# Patient Record
Sex: Male | Born: 1970 | Race: White | Hispanic: No | Marital: Married | Smoking: Never smoker
Health system: Southern US, Community
[De-identification: ages and names within clinical notes are randomized; demographics above are authoritative.]

## PROBLEM LIST (undated history)

## (undated) DIAGNOSIS — C44621 Squamous cell carcinoma of skin of unspecified upper limb, including shoulder: Secondary | ICD-10-CM

## (undated) DIAGNOSIS — F419 Anxiety disorder, unspecified: Secondary | ICD-10-CM

## (undated) DIAGNOSIS — Z8669 Personal history of other diseases of the nervous system and sense organs: Secondary | ICD-10-CM

## (undated) DIAGNOSIS — Z8589 Personal history of malignant neoplasm of other organs and systems: Secondary | ICD-10-CM

## (undated) DIAGNOSIS — Z8709 Personal history of other diseases of the respiratory system: Secondary | ICD-10-CM

## (undated) DIAGNOSIS — G4730 Sleep apnea, unspecified: Secondary | ICD-10-CM

## (undated) DIAGNOSIS — Z8701 Personal history of pneumonia (recurrent): Secondary | ICD-10-CM

## (undated) HISTORY — DX: Personal history of pneumonia (recurrent): Z87.01

## (undated) HISTORY — DX: Personal history of other diseases of the respiratory system: Z87.09

## (undated) HISTORY — DX: Anxiety disorder, unspecified: F41.9

## (undated) HISTORY — DX: Personal history of other diseases of the nervous system and sense organs: Z86.69

## (undated) HISTORY — DX: Squamous cell carcinoma of skin of unspecified upper limb, including shoulder: C44.621

---
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
## 2002-12-14 DIAGNOSIS — Z8669 Personal history of other diseases of the nervous system and sense organs: Secondary | ICD-10-CM

## 2002-12-14 HISTORY — DX: Personal history of other diseases of the nervous system and sense organs: Z86.69

## 2005-07-16 ENCOUNTER — Ambulatory Visit: Payer: Self-pay | Admitting: Family Medicine

## 2006-12-17 ENCOUNTER — Ambulatory Visit: Payer: Self-pay | Admitting: Family Medicine

## 2007-01-17 ENCOUNTER — Ambulatory Visit: Payer: Self-pay | Admitting: Family Medicine

## 2007-04-25 ENCOUNTER — Telehealth (INDEPENDENT_AMBULATORY_CARE_PROVIDER_SITE_OTHER): Payer: Self-pay | Admitting: Internal Medicine

## 2007-07-14 DIAGNOSIS — Z8709 Personal history of other diseases of the respiratory system: Secondary | ICD-10-CM | POA: Insufficient documentation

## 2007-07-14 HISTORY — DX: Personal history of other diseases of the respiratory system: Z87.09

## 2007-07-19 ENCOUNTER — Ambulatory Visit: Payer: Self-pay | Admitting: Family Medicine

## 2007-07-19 DIAGNOSIS — M25532 Pain in left wrist: Secondary | ICD-10-CM | POA: Insufficient documentation

## 2007-07-19 DIAGNOSIS — F40243 Fear of flying: Secondary | ICD-10-CM

## 2007-11-24 ENCOUNTER — Ambulatory Visit: Payer: Self-pay | Admitting: Family Medicine

## 2007-11-25 ENCOUNTER — Encounter (INDEPENDENT_AMBULATORY_CARE_PROVIDER_SITE_OTHER): Payer: Self-pay | Admitting: Internal Medicine

## 2008-01-20 ENCOUNTER — Ambulatory Visit: Payer: Self-pay | Admitting: Family Medicine

## 2008-01-20 DIAGNOSIS — R21 Rash and other nonspecific skin eruption: Secondary | ICD-10-CM

## 2008-01-31 ENCOUNTER — Telehealth (INDEPENDENT_AMBULATORY_CARE_PROVIDER_SITE_OTHER): Payer: Self-pay | Admitting: Internal Medicine

## 2008-02-21 ENCOUNTER — Ambulatory Visit: Payer: Self-pay | Admitting: Family Medicine

## 2008-02-21 ENCOUNTER — Encounter (INDEPENDENT_AMBULATORY_CARE_PROVIDER_SITE_OTHER): Payer: Self-pay | Admitting: *Deleted

## 2008-03-01 ENCOUNTER — Ambulatory Visit: Payer: Self-pay | Admitting: Family Medicine

## 2008-03-01 DIAGNOSIS — J069 Acute upper respiratory infection, unspecified: Secondary | ICD-10-CM | POA: Insufficient documentation

## 2008-03-09 ENCOUNTER — Telehealth (INDEPENDENT_AMBULATORY_CARE_PROVIDER_SITE_OTHER): Payer: Self-pay | Admitting: Internal Medicine

## 2008-03-22 ENCOUNTER — Ambulatory Visit: Payer: Self-pay | Admitting: Family Medicine

## 2008-04-20 ENCOUNTER — Telehealth (INDEPENDENT_AMBULATORY_CARE_PROVIDER_SITE_OTHER): Payer: Self-pay | Admitting: Internal Medicine

## 2008-04-20 ENCOUNTER — Ambulatory Visit: Payer: Self-pay | Admitting: Family Medicine

## 2008-04-20 DIAGNOSIS — J309 Allergic rhinitis, unspecified: Secondary | ICD-10-CM | POA: Insufficient documentation

## 2008-08-28 ENCOUNTER — Ambulatory Visit: Payer: Self-pay | Admitting: Obstetrics and Gynecology

## 2008-11-12 ENCOUNTER — Telehealth: Payer: Self-pay | Admitting: Internal Medicine

## 2009-02-04 ENCOUNTER — Telehealth: Payer: Self-pay | Admitting: Family Medicine

## 2009-02-26 ENCOUNTER — Ambulatory Visit: Payer: Self-pay | Admitting: Family Medicine

## 2009-02-26 DIAGNOSIS — K5289 Other specified noninfective gastroenteritis and colitis: Secondary | ICD-10-CM

## 2009-04-26 ENCOUNTER — Telehealth (INDEPENDENT_AMBULATORY_CARE_PROVIDER_SITE_OTHER): Payer: Self-pay | Admitting: Internal Medicine

## 2009-06-19 ENCOUNTER — Telehealth (INDEPENDENT_AMBULATORY_CARE_PROVIDER_SITE_OTHER): Payer: Self-pay | Admitting: Internal Medicine

## 2009-10-14 ENCOUNTER — Telehealth: Payer: Self-pay | Admitting: Family Medicine

## 2009-11-15 ENCOUNTER — Telehealth (INDEPENDENT_AMBULATORY_CARE_PROVIDER_SITE_OTHER): Payer: Self-pay | Admitting: Internal Medicine

## 2009-11-21 ENCOUNTER — Ambulatory Visit: Payer: Self-pay | Admitting: Family Medicine

## 2009-12-18 ENCOUNTER — Telehealth (INDEPENDENT_AMBULATORY_CARE_PROVIDER_SITE_OTHER): Payer: Self-pay | Admitting: Internal Medicine

## 2010-02-03 ENCOUNTER — Ambulatory Visit: Payer: Self-pay | Admitting: Family Medicine

## 2010-05-14 ENCOUNTER — Telehealth: Payer: Self-pay | Admitting: Family Medicine

## 2011-01-13 NOTE — Assessment & Plan Note (Signed)
Summary: CONGESTION,WHEEZING/CLE   Vital Signs:  Patient profile:   40 year old male Height:      65 inches Weight:      205.25 pounds BMI:     34.28 Temp:     99.6 degrees F oral Pulse rate:   88 / minute Pulse rhythm:   regular BP sitting:   122 / 80  (left arm) Cuff size:   large  Vitals Entered By: Delilah Shan CMA [facility]ull) (February 03, 2010 10:59 AM) CC: Congestion, wheezing   History of Present Illness: 40 yo with 1 week of URI symptoms. Started as runny nose, now moved to chest. Dry cough and wheezing started last night. Can't sleep.   Subjective fevers, night sweats. No shortness of breath or chest pain. Wife had similar symptoms last week and just got back from business trip where a lot of people were sick as well. No body aches. No n/v/d.  Current Medications (verified): 1)  4 Way Nasal Spray .... As Needed 2)  Alprazolam 0.25 Mg  Tabs (Alprazolam) .Marland Kitchen.. 1 30 To 60 Min Before Flight, Repeatin 4-6h As Needed Anxiety 3)  Azithromycin 250 Mg  Tabs (Azithromycin) .... 2 By  Mouth Today and Then 1 Daily For 4 Days 4)  Cheratussin Ac 100-10 Mg/49ml Syrp (Guaifenesin-Codeine) .... 5 Ml At Bedtime As Needed Cough.  Allergies (verified): No Known Drug Allergies  Review of Systems      See HPI General:  Complains of chills and fever. ENT:  Complains of nasal congestion; denies postnasal drainage, sinus pressure, and sore throat. CV:  Denies chest pain or discomfort. Resp:  Complains of cough and wheezing; denies shortness of breath and sputum productive. GI:  Denies abdominal pain, nausea, and vomiting.  Physical Exam  General:  alert, well-developed, well-nourished, and well-hydrated.  No increased WOB. Ears:  TMs retracted bilaterally. Nose:  mucosal erythema.   Mouth:  Oral mucosa and oropharynx without lesions or exudates.  Teeth in good repair. Lungs:  normal respiratory effort, no intercostal retractions, no accessory muscle use, and normal breath sounds.     Heart:  normal rate, regular rhythm, and no murmur.   Extremities:  no edema Psych:  normally interactive, not anxious appearing, and not depressed appearing.     Impression & Recommendations:  Problem # 1:  URI (ICD-465.9) Assessment New Given progression of symptoms, will treat for bronchitis/sinusitis with Zpack. Cheratussin as needed coughing spells. See pt instructions for details. His updated medication list for this problem includes:    Cheratussin Ac 100-10 Mg/52ml Syrp (Guaifenesin-codeine) .Marland KitchenMarland KitchenMarland KitchenMarland Kitchen 5 ml at bedtime as needed cough.  Complete Medication List: 1)  4 Way Nasal Spray  .... As needed 2)  Alprazolam 0.25 Mg Tabs (Alprazolam) .Marland Kitchen.. 1 30 to 60 min before flight, repeatin 4-6h as needed anxiety 3)  Azithromycin 250 Mg Tabs (Azithromycin) .... 2 by  mouth today and then 1 daily for 4 days 4)  Cheratussin Ac 100-10 Mg/44ml Syrp (Guaifenesin-codeine) .... 5 ml at bedtime as needed cough.  Patient Instructions: 1)  Take Zpack as directed.  Drink lots of fluids.  Treat sympotmatically with Mucinex, nasal saline irrigation, and Tylenol/Ibuprofen.  Cough suppressant at night. Call if not improving as expected in 5-7 days.  Prescriptions: CHERATUSSIN AC 100-10 MG/5ML SYRP (GUAIFENESIN-CODEINE) 5 ml at bedtime as needed cough.  #4 ounces x 0   Entered and Authorized by:   Ruthe Mannan MD   Signed by:   Ruthe Mannan MD on 02/03/2010   Method  used:   Print then Give to Patient   RxID:   [id number] AZITHROMYCIN 250 MG  TABS (AZITHROMYCIN) 2 by  mouth today and then 1 daily for 4 days  #6 x 0   Entered and Authorized by:   Ruthe Mannan MD   Signed by:   Ruthe Mannan MD on 02/03/2010   Method used:   Electronically to        [facility]* (retail)       [address]       [place], [place]  [address]       Ph: [phone]       Fax: [phone]   RxID:   [id number]   Current Allergies (reviewed today): No known allergies

## 2011-01-13 NOTE — Progress Notes (Signed)
Summary: Rx Zoloft  Phone Note Refill Request Call back at [phone] Message from:  [facility] on December 18, 2009 9:38 AM  Refills Requested: Medication #1:  ZOLOFT 50 MG  TABS Take one by mouth once a day   Last Refilled: 11/15/2009 Received faxed refill request, please advise   Method Requested: Telephone to Pharmacy Initial call taken by: Linde Gillis CMA [facility]),  December 18, 2009 9:39 AM  Follow-up for Phone Call        Rx completed  Billie-Lynn Tyler Deis FNP  December 18, 2009 12:26 PM   Medication phoned to [facility] pharmacy as instructed. Lewanda Rife LPN  December 18, 2009 2:53 PM     Prescriptions: ZOLOFT 50 MG  TABS (SERTRALINE HCL) Take one by mouth once a day  #30 x 6   Entered and Authorized by:   Gildardo Griffes FNP   Signed by:   Gildardo Griffes FNP on 12/18/2009   Method used:   Telephoned to ...         RxID:   [id number]

## 2011-01-13 NOTE — Progress Notes (Signed)
Summary: refill request for alprazolam  Phone Note Refill Request Message from:  Fax from Pharmacy  Refills Requested: Medication #1:  ALPRAZOLAM 0.25 MG  TABS 1 30 to 60 min before flight Faxed request from [place], [phone].  Initial call taken by: Lowella Petties CMA,  May 14, 2010 12:34 PM  Follow-up for Phone Call        Called to [facility]. Follow-up by: Lowella Petties CMA,  May 14, 2010 4:14 PM    Prescriptions: ALPRAZOLAM 0.25 MG  TABS (ALPRAZOLAM) 1 30 to 60 min before flight, repeatin 4-6h as needed anxiety  #10 x 0   Entered and Authorized by:   Shaune Leeks MD   Signed by:   Shaune Leeks MD on 05/14/2010   Method used:   Telephoned to ...       [facility]* (retail)       [address]       [place], [place]  [address]       Ph: [phone]       Fax: [phone]   RxID:   [id number]

## 2011-06-10 ENCOUNTER — Other Ambulatory Visit: Payer: Self-pay | Admitting: *Deleted

## 2011-06-13 ENCOUNTER — Other Ambulatory Visit: Payer: Self-pay | Admitting: Family Medicine

## 2011-06-13 DIAGNOSIS — Z0000 Encounter for general adult medical examination without abnormal findings: Secondary | ICD-10-CM

## 2011-06-16 ENCOUNTER — Encounter: Payer: Self-pay | Admitting: Family Medicine

## 2011-06-16 ENCOUNTER — Other Ambulatory Visit (INDEPENDENT_AMBULATORY_CARE_PROVIDER_SITE_OTHER): Payer: 59 | Admitting: Family Medicine

## 2011-06-16 DIAGNOSIS — Z0000 Encounter for general adult medical examination without abnormal findings: Secondary | ICD-10-CM

## 2011-06-16 LAB — LIPID PANEL
HDL: 42.8 mg/dL (ref >39.00)
Total CHOL/HDL Ratio: 5
Triglycerides: 114 mg/dL (ref 0.0–149.0)

## 2011-06-16 LAB — COMPREHENSIVE METABOLIC PANEL
ALT: 26 U/L (ref 0–53)
Albumin: 4.3 g/dL (ref 3.5–5.2)
CO2: 27 mEq/L (ref 19–32)
Calcium: 9.1 mg/dL (ref 8.4–10.5)
Chloride: 109 mEq/L (ref 96–112)
GFR: 86.09 mL/min (ref >60.00)
Glucose, Bld: 94 mg/dL (ref 70–99)
Sodium: 142 mEq/L (ref 135–145)
Total Bilirubin: 0.6 mg/dL (ref 0.3–1.2)
Total Protein: 7.1 g/dL (ref 6.0–8.3)

## 2011-06-19 ENCOUNTER — Ambulatory Visit (INDEPENDENT_AMBULATORY_CARE_PROVIDER_SITE_OTHER): Payer: 59 | Admitting: Family Medicine

## 2011-06-19 ENCOUNTER — Encounter: Payer: Self-pay | Admitting: Family Medicine

## 2011-06-19 VITALS — BP 128/84 | HR 76 | Temp 98.7°F | Ht 66.0 in | Wt 201.1 lb

## 2011-06-19 DIAGNOSIS — Z1211 Encounter for screening for malignant neoplasm of colon: Secondary | ICD-10-CM | POA: Insufficient documentation

## 2011-06-19 DIAGNOSIS — F988 Other specified behavioral and emotional disorders with onset usually occurring in childhood and adolescence: Secondary | ICD-10-CM

## 2011-06-19 DIAGNOSIS — Z0000 Encounter for general adult medical examination without abnormal findings: Secondary | ICD-10-CM | POA: Insufficient documentation

## 2011-06-19 NOTE — Assessment & Plan Note (Signed)
Some description consistent with ADD, however no childhood sxs. Will refer for testing, pt states affecting work.

## 2011-06-19 NOTE — Patient Instructions (Addendum)
Things are looking good today. You're up to date on tetanus. Blood work copy provided. Keep up good work. Work on more water, more fruits and vegetables. Return in 1-2 years for another physical. Pass by [facility]'s or Cynthia's office for referral.

## 2011-06-19 NOTE — Assessment & Plan Note (Signed)
Discussed healthy living, healthy eating. utd tetanus 2006. Reviewed blood work with patient.

## 2011-06-19 NOTE — Progress Notes (Signed)
Subjective:    Patient ID: Nicholas Saunders, male    DOB: 12-Jul-1971, 40 y.o.   MRN: [id number]  HPI CC: CPE  Presents to transfer care.  Wonders about ADHD.  Easily loses train of though at work, distractibility at work.  Thinks affecting job.  Also notes occurs at church and at home with wife.  Never had any issues as child.  Thinks progressing.  H/o anxiety but now ok.  Not hyperactive.  Doesn't feel depressed, has hobbies, no anhedonia.  Would like referral for testing.  + anxiety with flying, controlled well with xanax.  Will call when next flight for prescription.  Preventative: Last tetanus shot 2006. Last CPE last year.  Medications and allergies reviewed and updated in chart.   Patient Active Problem List  Diagnoses  . PANIC DISORDER  . ADD  . ALLERGIC RHINITIS  . WRIST PAIN, RIGHT  . RASH-NONVESICULAR  . PNEUMONIA, HX OF   Past Medical History  Diagnosis Date  . H/O: pneumonia age [age]   No past surgical history on file. History  Substance Use Topics  . Smoking status: Never Smoker   . Smokeless tobacco: Former Neurosurgeon  . Alcohol Use: No   Family History  Problem Relation Age of Onset  . Alcohol abuse Father   . Cirrhosis Father     Alcohol induced  . Throat cancer Mother     + smoker  . Cancer Mother     esophageal  . Alcohol abuse Brother   . Anxiety disorder Sister   . Stroke Maternal Grandfather   . Heart disease Maternal Grandfather   . Heart disease Maternal Grandmother     and MGGM  . Alcohol abuse Paternal Grandfather   . Diabetes Neg Hx    No Known Allergies Current Outpatient Prescriptions on File Prior to Visit  Medication Sig Dispense Refill  . Xylometazoline HCl (4-WAY NASAL SPRAY NA) Place into the nose as needed.        Marland Kitchen DISCONTD: ALPRAZolam (XANAX) 0.25 MG tablet Take 0.25 mg by mouth as needed. 1 30-60 minutes before flight; repeat in 4-6 hours as needed for anxiety         Review of Systems  Constitutional: Negative for fever,  chills, activity change, appetite change, fatigue and unexpected weight change.  HENT: Negative for hearing loss and neck pain.   Eyes: Negative for visual disturbance.  Respiratory: Negative for cough, chest tightness, shortness of breath and wheezing.   Cardiovascular: Negative for chest pain, palpitations and leg swelling.  Gastrointestinal: Negative for nausea, vomiting, abdominal pain, diarrhea, constipation, blood in stool and abdominal distention.  Genitourinary: Negative for hematuria and difficulty urinating.  Musculoskeletal: Negative for myalgias and arthralgias.  Skin: Negative for rash.  Neurological: Negative for dizziness, seizures, syncope and headaches.  Hematological: Does not bruise/bleed easily.  Psychiatric/Behavioral: Negative for dysphoric mood. The patient is not nervous/anxious.        Objective:   Physical Exam  Nursing note and vitals reviewed. Constitutional: He is oriented to person, place, and time. He appears well-developed and well-nourished. No distress.  HENT:  Head: Normocephalic and atraumatic.  Right Ear: External ear normal.  Left Ear: External ear normal.  Nose: Nose normal.  Mouth/Throat: Oropharynx is clear and moist.  Eyes: Conjunctivae and EOM are normal. Pupils are equal, round, and reactive to light.  Neck: Normal range of motion. Neck supple. No thyromegaly present.  Cardiovascular: Normal rate, regular rhythm, normal heart sounds and intact distal pulses.  No murmur heard. Pulses:      Radial pulses are 2+ on the right side, and 2+ on the left side.  Pulmonary/Chest: Effort normal and breath sounds normal. No respiratory distress. He has no wheezes. He has no rales.  Abdominal: Soft. Bowel sounds are normal. He exhibits no distension and no mass. There is no tenderness. There is no rebound and no guarding.  Musculoskeletal: Normal range of motion.  Lymphadenopathy:    He has no cervical adenopathy.  Neurological: He is alert and  oriented to person, place, and time.       CN grossly intact, station and gait intact  Skin: Skin is warm and dry. No rash noted.  Psychiatric: He has a normal mood and affect. His behavior is normal. Judgment and thought content normal.          Assessment & Plan:

## 2011-07-15 ENCOUNTER — Ambulatory Visit (INDEPENDENT_AMBULATORY_CARE_PROVIDER_SITE_OTHER): Payer: 59 | Admitting: Psychology

## 2011-07-15 DIAGNOSIS — F4323 Adjustment disorder with mixed anxiety and depressed mood: Secondary | ICD-10-CM

## 2011-07-20 ENCOUNTER — Other Ambulatory Visit (INDEPENDENT_AMBULATORY_CARE_PROVIDER_SITE_OTHER): Payer: 59

## 2011-07-20 DIAGNOSIS — F4323 Adjustment disorder with mixed anxiety and depressed mood: Secondary | ICD-10-CM

## 2011-07-22 ENCOUNTER — Ambulatory Visit (INDEPENDENT_AMBULATORY_CARE_PROVIDER_SITE_OTHER): Payer: 59 | Admitting: Psychology

## 2011-07-22 DIAGNOSIS — F909 Attention-deficit hyperactivity disorder, unspecified type: Secondary | ICD-10-CM

## 2011-12-21 ENCOUNTER — Other Ambulatory Visit: Payer: Self-pay | Admitting: *Deleted

## 2011-12-21 MED ORDER — ALPRAZOLAM 0.25 MG PO TABS: ORAL_TABLET | ORAL | Status: DC

## 2011-12-21 NOTE — Telephone Encounter (Signed)
plz phone in. 

## 2011-12-22 NOTE — Telephone Encounter (Signed)
Rx called in as directed.   

## 2011-12-31 ENCOUNTER — Ambulatory Visit (INDEPENDENT_AMBULATORY_CARE_PROVIDER_SITE_OTHER): Payer: 59 | Admitting: Family Medicine

## 2011-12-31 ENCOUNTER — Encounter: Payer: Self-pay | Admitting: Family Medicine

## 2011-12-31 DIAGNOSIS — F418 Other specified anxiety disorders: Secondary | ICD-10-CM | POA: Insufficient documentation

## 2011-12-31 DIAGNOSIS — F410 Panic disorder [episodic paroxysmal anxiety] without agoraphobia: Secondary | ICD-10-CM

## 2011-12-31 DIAGNOSIS — F341 Dysthymic disorder: Secondary | ICD-10-CM

## 2011-12-31 DIAGNOSIS — F329 Major depressive disorder, single episode, unspecified: Secondary | ICD-10-CM

## 2011-12-31 MED ORDER — ALPRAZOLAM 0.25 MG PO TABS: ORAL_TABLET | ORAL | Status: DC

## 2011-12-31 MED ORDER — CITALOPRAM HYDROBROMIDE 10 MG PO TABS: 10.0000 mg | ORAL_TABLET | Freq: Every day | ORAL | Status: DC

## 2011-12-31 MED ORDER — ALPRAZOLAM 0.25 MG PO TABS: 0.2500 mg | ORAL_TABLET | Freq: Two times a day (BID) | ORAL | Status: DC | PRN

## 2011-12-31 NOTE — Assessment & Plan Note (Addendum)
In h/o panic attacks, sound like return of panic attacks. Also endorsing 34mo h/o depressive sxs. PHQ9 = 18/27, very difficult to function endorsed GAD7 = 19/21 Discussed importance healthy coping strategies to relieve stress.  Thinks can increase activity (walking). Discussed counseling, pharmacotherapy Will start with celexa 10mg  daily, xanax prn while SSRI takes effect. RTC 1 mo for f/u, may discuss counseling then.   No SI/HI.

## 2011-12-31 NOTE — Progress Notes (Signed)
  Subjective:    Patient ID: Nicholas Saunders, male    DOB: 06-Feb-1971, 41 y.o.   MRN: [id number]  HPI CC: anxiety attacks  presents with wife.  For past 3-4 wks, having anxiety attacks that lead him to have chest pain, side and stomach pain, crying spells.  Started getting worse around Christmas.  Seems to happen during day while at work.  Not happy with job which increases stress (not motivated).  Also increased stress at home (niece and niece's daughter moved in, more financial responsibility).    Anxiety attacks described as abd pain like a stitch after running, sometimes sharp chest discomfort associated with trouble catching breath, twitch in forehead.  Denies dizziness, skipped beats, chest pressure/tightness, wheezing.  Decreased appetite with this.  Also endorses depression.  Going on for 3-6 mo.  Recently worsening last 3 wks.  Denies anhedonia.  Sleeping ok.  Low energy level, low concentration ability, appetite down, + guilty feelings.  + irritable.  No SI/HI.  No other manic sxs.  Took whole xanax yesterday which helped ability to deal significantly.  Stress relievers - taekwondo, playing drugs, active at church.  Prior hx anxiety related to driving, flying. That has improved.  Cousin, uncles with h/o anxiety, sister bipolar.  Lab Results  Component Value Date   LDLCALC 133* 06/16/2011   Medications and allergies reviewed and updated in chart.  Past histories reviewed and updated if relevant as below. Patient Active Problem List  Diagnoses  . PANIC DISORDER  . ADD  . ALLERGIC RHINITIS  . PNEUMONIA, HX OF  . Healthcare maintenance   Past Medical History  Diagnosis Date  . H/O: pneumonia age [age]  . H/O: Bell's palsy 2004  . Anxiety     plane rides   No past surgical history on file. History  Substance Use Topics  . Smoking status: Never Smoker   . Smokeless tobacco: Former Neurosurgeon  . Alcohol Use: No   Family History  Problem Relation Age of Onset  . Alcohol abuse  Father   . Cirrhosis Father     Alcohol induced  . Throat cancer Mother     + smoker  . Cancer Mother     esophageal  . Alcohol abuse Brother   . Anxiety disorder Sister   . Stroke Maternal Grandfather   . Heart disease Maternal Grandfather   . Heart disease Maternal Grandmother     and MGGM  . Alcohol abuse Paternal Grandfather   . Diabetes Neg Hx    No Known Allergies Current Outpatient Prescriptions on File Prior to Visit  Medication Sig Dispense Refill  . Xylometazoline HCl (4-WAY NASAL SPRAY NA) Place into the nose as needed.        . ALPRAZolam (XANAX) 0.25 MG tablet One tab 30 to 60 min before flight, repeat in 4-6h as needed anxiety  10 tablet  0    Review of Systems Per HPI    Objective:   Physical Exam  Nursing note and vitals reviewed. Constitutional: He appears well-developed and well-nourished. No distress.  Psychiatric: His speech is normal and behavior is normal. Judgment and thought content normal. His mood appears not anxious. His affect is not angry, not blunt, not labile and not inappropriate. Cognition and memory are normal. He exhibits a depressed mood.       Tears with discussion of stressors and worry about failing family.       Assessment & Plan:

## 2011-12-31 NOTE — Patient Instructions (Signed)
Work on healthy ways of managing stress. Start celexa 10mg  daily. Take xanax as needed for anxiety attack. Good to see you today.  Return in 1 month for follow up.

## 2012-01-01 ENCOUNTER — Telehealth: Payer: Self-pay | Admitting: *Deleted

## 2012-01-01 MED ORDER — SERTRALINE HCL 50 MG PO TABS: 50.0000 mg | ORAL_TABLET | Freq: Every day | ORAL | Status: DC

## 2012-01-01 NOTE — Telephone Encounter (Signed)
Discussed. Sounds like tachycardia to celexa. Tolerated zoloft in past.  Will change to this.

## 2012-01-01 NOTE — Telephone Encounter (Signed)
Patient called with questions about side effects of Celexa that he started yesterday. Patient stated that he took his first dose of Celexa yesterday and woke up about 4:30 this morning with his heart pounding and he could not get back to sleep. Patient called his pharmacy this morning because the office was not opened yet and he was told that they don't think his heart pounding was a side of effect of the medication because he had just taken one dose. Patient took another one today and his heart is racing a little. Please advise.

## 2012-02-01 ENCOUNTER — Ambulatory Visit (INDEPENDENT_AMBULATORY_CARE_PROVIDER_SITE_OTHER): Payer: 59 | Admitting: Family Medicine

## 2012-02-01 ENCOUNTER — Encounter: Payer: Self-pay | Admitting: Family Medicine

## 2012-02-01 VITALS — BP 118/80 | HR 68 | Temp 98.3°F | Wt 192.8 lb

## 2012-02-01 DIAGNOSIS — F419 Anxiety disorder, unspecified: Secondary | ICD-10-CM

## 2012-02-01 DIAGNOSIS — F341 Dysthymic disorder: Secondary | ICD-10-CM

## 2012-02-01 MED ORDER — SERTRALINE HCL 50 MG PO TABS: 50.0000 mg | ORAL_TABLET | Freq: Every day | ORAL | Status: DC

## 2012-02-01 NOTE — Patient Instructions (Signed)
Good to see you today, return in 6 months, sooner of needed. I'm glad things are better.

## 2012-02-01 NOTE — Progress Notes (Signed)
  Subjective:    Patient ID: Nicholas Saunders, male    DOB: 1971/01/08, 41 y.o.   MRN: [id number]  HPI CC: f/u anxiety/panic attacks  Seen here 12/31/2011 with worsening anxiety/depression, new panic attacks.  started on zoloft 50mg  daily and xanax to use prn.  Presents today for f/u.  Doing well today.  zoloft started working on day 3.  Things well at work, at home.  Some stress at work - recent Well[employer] ge[employer].   Hasn't needed xanax at all.  Feels managing anxiety very well.  Initially started celexa - had tachycardia-like reaction to this but has tolerated zoloft well with no nausea, other concerns.  Review of Systems Per HPI    Objective:   Physical Exam  Nursing note and vitals reviewed. Constitutional: He appears well-developed and well-nourished. No distress.  Psychiatric: He has a normal mood and affect. His behavior is normal. Judgment and thought content normal.      Assessment & Plan:

## 2012-02-01 NOTE — Assessment & Plan Note (Signed)
Improved on zoloft.  Rare need of xanax - will use on upcoming plane ride. PHQ9 = 18 --> 2 GAD7 = 19 --> 2 Continue meds as up to now, rtc 6 mo for reassessment, see if able to come off med.

## 2012-08-01 ENCOUNTER — Ambulatory Visit: Payer: 59 | Admitting: Family Medicine

## 2012-08-24 ENCOUNTER — Encounter: Payer: Self-pay | Admitting: Family Medicine

## 2012-08-24 ENCOUNTER — Ambulatory Visit (INDEPENDENT_AMBULATORY_CARE_PROVIDER_SITE_OTHER): Payer: 59 | Admitting: Family Medicine

## 2012-08-24 VITALS — BP 152/94 | HR 74 | Temp 98.6°F | Wt 199.5 lb

## 2012-08-24 DIAGNOSIS — M79671 Pain in right foot: Secondary | ICD-10-CM | POA: Insufficient documentation

## 2012-08-24 DIAGNOSIS — L989 Disorder of the skin and subcutaneous tissue, unspecified: Secondary | ICD-10-CM

## 2012-08-24 DIAGNOSIS — L918 Other hypertrophic disorders of the skin: Secondary | ICD-10-CM | POA: Insufficient documentation

## 2012-08-24 DIAGNOSIS — F329 Major depressive disorder, single episode, unspecified: Secondary | ICD-10-CM

## 2012-08-24 DIAGNOSIS — F341 Dysthymic disorder: Secondary | ICD-10-CM

## 2012-08-24 DIAGNOSIS — L919 Hypertrophic disorder of the skin, unspecified: Secondary | ICD-10-CM

## 2012-08-24 DIAGNOSIS — J029 Acute pharyngitis, unspecified: Secondary | ICD-10-CM

## 2012-08-24 DIAGNOSIS — M79609 Pain in unspecified limb: Secondary | ICD-10-CM

## 2012-08-24 MED ORDER — NAPROXEN 500 MG PO TABS: ORAL_TABLET | ORAL | Status: AC

## 2012-08-24 MED ORDER — FLUTICASONE PROPIONATE 50 MCG/ACT NA SUSP: 2.0000 | Freq: Every day | NASAL | Status: DC

## 2012-08-24 NOTE — Patient Instructions (Signed)
Stop phenylephrine nasal spray.  (that may be what is causing sore throat). Start simple over the counter nasal saline as well as flonase (nasal steroid to help with any sinus inflammation leading to congestion) but this will take 1-2 weeks to take effect. If nose bleeds with nasal steroid, stop and let me know. For toe pain - possibly bony bruise.  Take naprosyn twice daily with food for 5 days then as needed.  Try to avoid motions that cause pain.  If not better, let me know. Return at your convenience for skin tag removal.

## 2012-08-24 NOTE — Progress Notes (Signed)
  Subjective:    Patient ID: Nicholas Saunders, male    DOB: 1971/05/12, 41 y.o.   MRN: [id number]  HPI CC: 6 mo f/u  I asked pt to return for 6 mo f/u.  Anxiety - doing well on zoloft.  No problems.  Work stress remaining present.  Has been on zoloft for the last 7 mo.  Would like to continue.  Rarely needs alprazolam.  Only using for flight.   ST present for last month.  Worse in AMs, gradually gets better as day progresses.  No dysphagia.  Wonders if attributable to snoring and mouth breathing at night.  Never had significant problem with allergic rhinitis.  No PNdrainage.  No cough, RN, congestion.  Wonders about deviated septum - got hit with baseball several years ago   Chronically on phenylephrine nasal spray for several years 2/2 nasal congestion.  Elevated BP noted today.  Prior values all normal. BP Readings from Last 3 Encounters:  08/24/12 152/94  02/01/12 118/80  12/31/11 128/80    Skin tags - one on right lower back, another on LLQ abdomen.  Catch on clothes, get irritated.  Would like to have skin tags removed.  Skin lesion - noted raised lesion for last month L groin.  No other lesions, denies lesions in private area.  R great toe pain at MTP joint.  Present for several weeks.  Worse with taekwondo.  Bad pain last night, not really tender currently.  Review of Systems Per HPI    Objective:   Physical Exam  Nursing note and vitals reviewed. Constitutional: He appears well-developed and well-nourished. No distress.  HENT:  Head: Normocephalic and atraumatic.  Right Ear: Hearing, tympanic membrane, external ear and ear canal normal.  Left Ear: Hearing, tympanic membrane, external ear and ear canal normal.  Nose: Nose normal. No mucosal edema or rhinorrhea.  Mouth/Throat: Uvula is midline, oropharynx is clear and moist and mucous membranes are normal. No oropharyngeal exudate, posterior oropharyngeal edema, posterior oropharyngeal erythema or tonsillar abscesses.  Eyes:  Conjunctivae normal and EOM are normal. Pupils are equal, round, and reactive to light. No scleral icterus.  Neck: Normal range of motion. Neck supple.  Musculoskeletal: He exhibits no edema.       L foot WNL R foot - tender to palpation at specific point on dorsal MTP joint.  No erythema, edema, warmth.  No pain with flexion, extension or axial loading of great toe. No other metatarsal pain.  Lymphadenopathy:    He has no cervical adenopathy.  Skin: Skin is warm and dry. No rash noted.       Right lower back, left lower abdomen with acrochordons present.   Left groin at medial thigh with exophytic hyperpigmented skin growth with somewhat stuckon appearance.       Assessment & Plan:

## 2012-08-24 NOTE — Assessment & Plan Note (Signed)
Chronic, stable. Continue meds - zoloft, rare xanax use.

## 2012-08-24 NOTE — Assessment & Plan Note (Signed)
?  seborrheic keratosis vs condyloma. If bothersome, could consider shave biopsy when returns for skin tag removal.

## 2012-08-24 NOTE — Assessment & Plan Note (Signed)
Exam normal today.  No significant evidence of infectious cause. I wonder if due to irritation from chronic nasal decongestant overuse. Discussed recommended temporary use of nasal decongestants Recommended stop nasal phenylephrine, recommended start nasal saline, prescribed nasal steroid to use in its place.

## 2012-08-24 NOTE — Assessment & Plan Note (Signed)
Pt will return for removal. 

## 2012-08-24 NOTE — Assessment & Plan Note (Signed)
Has recently increased activity on feet (taekwondo) Anticipate bony contusion or mild tendonitis, less likely stress injury of bone. Doubt gout. Treat with rest, NSAIDs, update [place] if sxs don't improve - consider xray to eval stress fx or other cause of pain vs checking uric acid. Sent prescription of Naprosyn to pharmacy.

## 2012-08-31 ENCOUNTER — Ambulatory Visit (INDEPENDENT_AMBULATORY_CARE_PROVIDER_SITE_OTHER): Payer: 59 | Admitting: Family Medicine

## 2012-08-31 ENCOUNTER — Encounter: Payer: Self-pay | Admitting: Family Medicine

## 2012-08-31 VITALS — BP 110/80 | HR 72 | Temp 98.0°F | Wt 198.5 lb

## 2012-08-31 DIAGNOSIS — L918 Other hypertrophic disorders of the skin: Secondary | ICD-10-CM

## 2012-08-31 DIAGNOSIS — L989 Disorder of the skin and subcutaneous tissue, unspecified: Secondary | ICD-10-CM

## 2012-08-31 DIAGNOSIS — L909 Atrophic disorder of skin, unspecified: Secondary | ICD-10-CM

## 2012-08-31 NOTE — Assessment & Plan Note (Signed)
See above

## 2012-08-31 NOTE — Patient Instructions (Signed)
wound care discussed.

## 2012-08-31 NOTE — Progress Notes (Signed)
CC: skin tag removal   S: See prior note for details.  Skin tags - one on right lower back, another on LLQ abdomen. Catch on clothes, get irritated. Would like to have skin tags removed.  Skin lesion - noted raised lesion for last month L groin. No other lesions, denies lesions in private area.  O:  Patient appears well. Several benign skin tags are noted as per prior PE.   Right lower back, left lower abdomen with acrochordons present.  Left groin at medial thigh with exophytic hyperpigmented skin growth with somewhat stuckon appearance.  Groin lesion measured - 0.5cm. A:  Skin tags  Pigmented SK  P:  Skin tags are snipped off using EtOH for cleansing, 1% lidocaine with epi and buffered with bicarb for anesthesia, and sterile iris scissors. These pathognomonic lesions are not sent for pathology.  Silver nitrate used to achieve hemostasis. Monitor SK, notify for further treatment if changing or more bothersome.   Pt agrees with plan

## 2012-12-10 ENCOUNTER — Other Ambulatory Visit: Payer: Self-pay | Admitting: Family Medicine

## 2013-01-23 ENCOUNTER — Other Ambulatory Visit: Payer: Self-pay | Admitting: Family Medicine

## 2013-01-23 MED ORDER — FLUTICASONE PROPIONATE 50 MCG/ACT NA SUSP: NASAL | Status: DC

## 2013-01-23 NOTE — Addendum Note (Signed)
Addended by: Josph Macho A on: 01/23/2013 02:17 PM   Modules accepted: Orders

## 2013-01-30 ENCOUNTER — Ambulatory Visit (INDEPENDENT_AMBULATORY_CARE_PROVIDER_SITE_OTHER): Payer: 59 | Admitting: Family Medicine

## 2013-01-30 ENCOUNTER — Encounter: Payer: Self-pay | Admitting: Family Medicine

## 2013-01-30 VITALS — BP 114/78 | HR 80 | Temp 98.2°F | Wt 201.5 lb

## 2013-01-30 DIAGNOSIS — J209 Acute bronchitis, unspecified: Secondary | ICD-10-CM

## 2013-01-30 MED ORDER — ALBUTEROL SULFATE HFA 108 (90 BASE) MCG/ACT IN AERS: 2.0000 | INHALATION_SPRAY | Freq: Four times a day (QID) | RESPIRATORY_TRACT | Status: DC | PRN

## 2013-01-30 MED ORDER — AZITHROMYCIN 250 MG PO TABS: ORAL_TABLET | ORAL | Status: DC

## 2013-01-30 NOTE — Progress Notes (Signed)
  Subjective:    Patient ID: Nicholas Saunders, male    DOB: 1970/12/21, 42 y.o.   MRN: [id number]  HPI CC: chest cold  3d h/o chest congestion - deep hoarse cough.  Mildly productive of sputum.  + HA from cough.  mild PNDrainage.  Chest > nasal congestion.  Some wheezing and tightness esp LLL.  Able to sleep at night.  So far has tried night time OTC med and dayquil.  Did help some.  No fevers/chills, abd pain, nausea, ear or tooth pain, ST.  No coughing fits.  No sick contacts at home. No smokers at home. No h/o asthma. + h/o allergic rhintiis.  H/o PNA as child. Past Medical History  Diagnosis Date  . H/O: pneumonia age 11-13  . H/O: Bell's palsy 2004  . Anxiety     plane rides     Review of Systems Per HPI    Objective:   Physical Exam  Nursing note and vitals reviewed. Constitutional: He appears well-developed and well-nourished. No distress.  HENT:  Head: Normocephalic and atraumatic.  Right Ear: Hearing, tympanic membrane, external ear and ear canal normal.  Left Ear: Hearing, tympanic membrane, external ear and ear canal normal.  Nose: Mucosal edema present. No rhinorrhea. Right sinus exhibits no maxillary sinus tenderness and no frontal sinus tenderness. Left sinus exhibits no maxillary sinus tenderness and no frontal sinus tenderness.  Mouth/Throat: Uvula is midline and mucous membranes are normal. Posterior oropharyngeal erythema present. No oropharyngeal exudate, posterior oropharyngeal edema or tonsillar abscesses.  Irritated nasal mucosa. Posterior oropharyngeal cobblestoning  Eyes: Conjunctivae and EOM are normal. Pupils are equal, round, and reactive to light. No scleral icterus.  Neck: Normal range of motion. Neck supple.  Cardiovascular: Normal rate, regular rhythm, normal heart sounds and intact distal pulses.   No murmur heard. Pulmonary/Chest: Effort normal. No respiratory distress. He has no decreased breath sounds. He has wheezes (end exp wheezing.) in  the right upper field and the left upper field. He has no rales.  Lymphadenopathy:    He has no cervical adenopathy.  Skin: Skin is warm and dry. No rash noted.       Assessment & Plan:

## 2013-01-30 NOTE — Assessment & Plan Note (Signed)
With reactive airway inflammation and wheeze. Discussed anticipated viral etiology as early on in process and no sxs of bacterial cause. Treat RAD with albuterol inhaler - discussed administration technique as well as common side effects. supportive care as per instructions. If not better, treat with azithromycin. Declines prescription cough syrup.

## 2013-01-30 NOTE — Patient Instructions (Signed)
I do think you have bronchitis but likely viral. Push fluids and plenty of rest. Use simple mucinex with plenty of fluid to help mobilize mucous. Bronchitis has caused some reactive airways - leading to wheezing.  Treat this with albuterol inhaler - sent to pharmacy. I think this should get better with time. If worsening productive cough, fever >101, or worsening instead of improving, fill antibiotic (zpack prescription provided)

## 2013-06-29 ENCOUNTER — Other Ambulatory Visit: Payer: Self-pay | Admitting: Family Medicine

## 2013-09-29 ENCOUNTER — Other Ambulatory Visit: Payer: Self-pay | Admitting: Family Medicine

## 2013-11-23 ENCOUNTER — Other Ambulatory Visit: Payer: Self-pay | Admitting: Family Medicine

## 2013-12-25 ENCOUNTER — Other Ambulatory Visit: Payer: Self-pay | Admitting: Family Medicine

## 2014-01-25 ENCOUNTER — Other Ambulatory Visit: Payer: Self-pay | Admitting: Family Medicine

## 2014-03-13 ENCOUNTER — Other Ambulatory Visit: Payer: Self-pay | Admitting: Family Medicine

## 2014-03-13 DIAGNOSIS — Z0000 Encounter for general adult medical examination without abnormal findings: Secondary | ICD-10-CM

## 2014-03-14 ENCOUNTER — Other Ambulatory Visit (INDEPENDENT_AMBULATORY_CARE_PROVIDER_SITE_OTHER): Payer: 59

## 2014-03-14 DIAGNOSIS — Z0000 Encounter for general adult medical examination without abnormal findings: Secondary | ICD-10-CM

## 2014-03-14 LAB — LIPID PANEL
CHOLESTEROL: 192 mg/dL (ref 0–200)
HDL: 36.7 mg/dL — ABNORMAL LOW (ref >39.00)
LDL CALC: 130 mg/dL — AB (ref 0–99)
Total CHOL/HDL Ratio: 5
Triglycerides: 127 mg/dL (ref 0.0–149.0)
VLDL: 25.4 mg/dL (ref 0.0–40.0)

## 2014-03-14 LAB — BASIC METABOLIC PANEL
BUN: 24 mg/dL — ABNORMAL HIGH (ref 6–23)
CALCIUM: 9.2 mg/dL (ref 8.4–10.5)
CHLORIDE: 105 meq/L (ref 96–112)
CO2: 28 mEq/L (ref 19–32)
CREATININE: 1.2 mg/dL (ref 0.4–1.5)
GFR: 69.74 mL/min (ref >60.00)
Glucose, Bld: 102 mg/dL — ABNORMAL HIGH (ref 70–99)
Potassium: 4.2 mEq/L (ref 3.5–5.1)
SODIUM: 140 meq/L (ref 135–145)

## 2014-03-19 ENCOUNTER — Encounter: Payer: 59 | Admitting: Family Medicine

## 2014-03-22 ENCOUNTER — Encounter: Payer: Self-pay | Admitting: Family Medicine

## 2014-03-22 ENCOUNTER — Ambulatory Visit (INDEPENDENT_AMBULATORY_CARE_PROVIDER_SITE_OTHER): Payer: 59 | Admitting: Family Medicine

## 2014-03-22 VITALS — BP 122/70 | HR 80 | Temp 98.0°F | Ht 66.0 in | Wt 207.5 lb

## 2014-03-22 DIAGNOSIS — Z23 Encounter for immunization: Secondary | ICD-10-CM

## 2014-03-22 DIAGNOSIS — Z7184 Encounter for health counseling related to travel: Secondary | ICD-10-CM

## 2014-03-22 DIAGNOSIS — R739 Hyperglycemia, unspecified: Secondary | ICD-10-CM

## 2014-03-22 DIAGNOSIS — R5383 Other fatigue: Secondary | ICD-10-CM | POA: Insufficient documentation

## 2014-03-22 DIAGNOSIS — Z2839 Other underimmunization status: Secondary | ICD-10-CM

## 2014-03-22 DIAGNOSIS — R1013 Epigastric pain: Secondary | ICD-10-CM

## 2014-03-22 DIAGNOSIS — Z0000 Encounter for general adult medical examination without abnormal findings: Secondary | ICD-10-CM

## 2014-03-22 DIAGNOSIS — Z9189 Other specified personal risk factors, not elsewhere classified: Secondary | ICD-10-CM

## 2014-03-22 MED ORDER — OMEPRAZOLE 40 MG PO CPDR: 40.0000 mg | DELAYED_RELEASE_CAPSULE | Freq: Every day | ORAL | Status: DC

## 2014-03-22 MED ORDER — SERTRALINE HCL 50 MG PO TABS: ORAL_TABLET | ORAL | Status: DC

## 2014-03-22 MED ORDER — DOXYCYCLINE HYCLATE 100 MG PO CAPS: 100.0000 mg | ORAL_CAPSULE | Freq: Every day | ORAL | Status: DC

## 2014-03-22 MED ORDER — CIPROFLOXACIN HCL 500 MG PO TABS: 500.0000 mg | ORAL_TABLET | Freq: Two times a day (BID) | ORAL | Status: DC

## 2014-03-22 MED ORDER — ALPRAZOLAM 0.25 MG PO TABS
0.2500 mg | ORAL_TABLET | Freq: Every evening | ORAL | Status: DC | PRN
Start: 2014-03-22 — End: 2014-07-11

## 2014-03-22 MED ORDER — TYPHOID VACCINE PO CPDR: 1.0000 | DELAYED_RELEASE_CAPSULE | ORAL | Status: DC

## 2014-03-22 MED ORDER — FLUTICASONE PROPIONATE 50 MCG/ACT NA SUSP: NASAL | Status: DC

## 2014-03-22 NOTE — Patient Instructions (Signed)
For indigestion - start nexium samples provided today then omeprazole 40mg  daily.  If persistent symptoms let me know for referral to GI. Return at your convenience one morning at [place] for labwork. Hep A/B series started today. Tetanus and pertussis today. Prescriptions for other immunizations and antibiotics provided today. Let me know if indigestion symptoms aren't getting better.

## 2014-03-22 NOTE — Progress Notes (Signed)
BP 122/70  Pulse 80  Temp(Src) 98 F (36.7 C) (Oral)  Ht 5\' 6"  (1.676 m)  Wt 207 lb 8 oz (94.121 kg)  BMI 33.51 kg/m2  SpO2 97%   CC: CPE  Subjective:    Patient ID: Nicholas Saunders, male    DOB: 27-Mar-1971, 43 y.o.   MRN: [id number]  HPI: Nicholas Saunders is a 43 y.o. male presenting on 03/22/2014 for Annual Exam   Pending trip to [place] in August - mission trip with church.  Requests immunizations today.  CDC recommendation is malaria, typhoid, Hep A/B.  Also will receive Tdap today.  Also will receive Rx for treatment of traveler's diarrhea.  Noticing constant epigastric pressure/discomfort "like gas bubble".  Hasn't tried OTC remedies for this.  Denies heartburn sxs or food relation.  Noticing more in am at rest, not exertion related.  No dysphagia or early satiety, nausea/vomiting.  +fmhx esophageal cancer (mother smoker) at age 17 yo.    Wt Readings from Last 3 Encounters:  03/22/14 207 lb 8 oz (94.121 kg)  01/30/13 201 lb 8 oz (91.4 kg)  08/31/12 198 lb 8 oz (90.039 kg)  Body mass index is 33.51 kg/(m^2). Wants to restart exercise regimen.  Recently bought camper. Staying tired.  Endorses good mood.  Decreased sex drive.  Wonders about low testosterone.  Preventative: Flu shot - did not receive Td 2006, Tdap today 2015 Seat belt use discussed Sunscreen use discussed.  + sunburns.  No changing spots on skin, suspicious moles.  Caffeine: 3-4 caffeinated beverages Married.  Lives with wife, 1 dog. 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  Allergies and medications reviewed and updated. Current Outpatient Prescriptions on File Prior to Visit  Medication Sig  . albuterol (PROVENTIL HFA;VENTOLIN HFA) 108 (90 BASE) MCG/ACT inhaler Inhale 2 puffs into the lungs every 6 (six) hours  as needed for wheezing.   No current facility-administered medications on file prior to visit.    Review of Systems  Constitutional: Negative for fever, chills, activity change, appetite change, fatigue and unexpected weight change.  HENT: Negative for hearing loss.   Eyes: Negative for visual disturbance.  Respiratory: Negative for cough, chest tightness, shortness of breath and wheezing.   Cardiovascular: Negative for chest pain, palpitations and leg swelling.  Gastrointestinal: Positive for abdominal pain (discomfort). Negative for nausea, vomiting, diarrhea, constipation, blood in stool and abdominal distention.  Genitourinary: Negative for hematuria and difficulty urinating.  Musculoskeletal: Negative for arthralgias, myalgias and neck pain.  Skin: Negative for rash.  Neurological: Negative for dizziness, seizures, syncope and headaches.  Hematological: Negative for adenopathy. Does not bruise/bleed easily.  Psychiatric/Behavioral: Negative for dysphoric mood. The patient is not nervous/anxious.    Per HPI unless specifically indicated above    Objective:    BP 122/70  Pulse 80  Temp(Src) 98 F (36.7 C) (Oral)  Ht 5\' 6"  (1.676 m)  Wt 207 lb 8 oz (94.121 kg)  BMI 33.51 kg/m2  SpO2 97%  Physical Exam  Nursing note and vitals reviewed. Constitutional: He is oriented to person, place, and time. He appears well-developed and well-nourished. No distress.  HENT:  Head: Normocephalic and atraumatic.  Right Ear: Hearing, tympanic membrane, external ear and ear canal normal.  Left Ear: Hearing, tympanic membrane, external ear and ear canal normal.  Nose: Nose normal.  Mouth/Throat: Uvula is midline,  oropharynx is clear and moist and mucous membranes are normal. No oropharyngeal exudate, posterior oropharyngeal edema or posterior oropharyngeal erythema.  Eyes: Conjunctivae and EOM are normal. Pupils are equal, round, and reactive to light. No scleral icterus.  Neck: Normal range of  motion. Neck supple. No thyromegaly present.  Cardiovascular: Normal rate, regular rhythm, normal heart sounds and intact distal pulses.   No murmur heard. Pulses:      Radial pulses are 2+ on the right side, and 2+ on the left side.  Pulmonary/Chest: Effort normal and breath sounds normal. No respiratory distress. He has no wheezes. He has no rales.  Abdominal: Soft. Bowel sounds are normal. He exhibits no distension and no mass. There is tenderness (mild) in the epigastric area. There is no rebound and no guarding.  Musculoskeletal: Normal range of motion. He exhibits no edema.  Lymphadenopathy:    He has no cervical adenopathy.  Neurological: He is alert and oriented to person, place, and time.  CN grossly intact, station and gait intact  Skin: Skin is warm and dry. No rash noted.  Psychiatric: He has a normal mood and affect. His behavior is normal. Judgment and thought content normal.   Results for orders placed in visit on 03/14/14  LIPID PANEL      Result Value Ref Range   Cholesterol 192  0 - 200 mg/dL   Triglycerides 127.0  0.0 - 149.0 mg/dL   HDL 36.70 (*) >39.00 mg/dL   VLDL 25.4  0.0 - 40.0 mg/dL   LDL Cholesterol 130 (*) 0 - 99 mg/dL   Total CHOL/HDL Ratio 5    BASIC METABOLIC PANEL      Result Value Ref Range   Sodium 140  135 - 145 mEq/L   Potassium 4.2  3.5 - 5.1 mEq/L   Chloride 105  96 - 112 mEq/L   CO2 28  19 - 32 mEq/L   Glucose, Bld 102 (*) 70 - 99 mg/dL   BUN 24 (*) 6 - 23 mg/dL   Creatinine, Ser 1.2  0.4 - 1.5 mg/dL   Calcium 9.2  8.4 - 10.5 mg/dL   GFR 69.74  >60.00 mL/min      Assessment & Plan:   Problem List Items Addressed This Visit   [place] maintenance - Primary     Preventative protocols reviewed and updated unless pt declined. Discussed healthy diet and lifestyle.  Tdap today.    Fatigue     Return for fasting blood work to check for reversible causes of fatigue including Testosterone.    Relevant Orders       Vitamin B12      TSH      CBC with Differential      Testosterone      Hepatic function panel      Hemoglobin A1c   Dyspepsia     Trial of PPI - nexium samples provided and omeprazole 40mg  daily sent in for 3 wk course. If persistent sxs or breakthrough sxs, low threshold to refer to GI given fmhx esophageal cancer at early age. Pt agrees with plan, aware of importance of notifying me if persistent sxs.    Travel advice encounter     Reviewed CDC recs. Start Hep A/B immunization series today. Tdap today. Check measles today. Provided with Rx for vivotif (typhoid vaccine), doxycycline (malaria ppx) and cipro (traveler's diarrhea tx) Discussed photosensitivity effect with doxy     Other Visit Diagnoses   Hyperglycemia  Immunization due        Relevant Orders       Tdap vaccine greater than or equal to 7yo IM (Completed)       Hepatitis A hepatitis B combined vaccine IM    Incomplete immunization status        Relevant Orders       [place] antibody IgG        Follow up plan: Return in about 1 year (around 03/23/2015), or as needed, for physical.

## 2014-03-22 NOTE — Progress Notes (Signed)
Pre visit review using our clinic review tool, if applicable. No additional management support is needed unless otherwise documented below in the visit note. 

## 2014-03-23 ENCOUNTER — Encounter: Payer: Self-pay | Admitting: Family Medicine

## 2014-03-23 DIAGNOSIS — R1013 Epigastric pain: Secondary | ICD-10-CM | POA: Insufficient documentation

## 2014-03-23 DIAGNOSIS — K219 Gastro-esophageal reflux disease without esophagitis: Secondary | ICD-10-CM | POA: Insufficient documentation

## 2014-03-23 DIAGNOSIS — Z7184 Encounter for health counseling related to travel: Secondary | ICD-10-CM | POA: Insufficient documentation

## 2014-03-23 NOTE — Assessment & Plan Note (Signed)
Reviewed CDC recs. Start Hep A/B immunization series today. Tdap today. Check measles today. Provided with Rx for vivotif (typhoid vaccine), doxycycline (malaria ppx) and cipro (traveler's diarrhea tx) Discussed photosensitivity effect with doxy

## 2014-03-23 NOTE — Assessment & Plan Note (Signed)
Return for fasting blood work to check for reversible causes of fatigue including Testosterone.

## 2014-03-23 NOTE — Assessment & Plan Note (Signed)
Trial of PPI - nexium samples provided and omeprazole 40mg  daily sent in for 3 wk course. If persistent sxs or breakthrough sxs, low threshold to refer to GI given fmhx esophageal cancer at early age. Pt agrees with plan, aware of importance of notifying me if persistent sxs.

## 2014-03-23 NOTE — Assessment & Plan Note (Signed)
Preventative protocols reviewed and updated unless pt declined. Discussed healthy diet and lifestyle.  Tdap today. 

## 2014-03-28 ENCOUNTER — Other Ambulatory Visit (INDEPENDENT_AMBULATORY_CARE_PROVIDER_SITE_OTHER): Payer: 59

## 2014-03-28 ENCOUNTER — Encounter: Payer: Self-pay | Admitting: *Deleted

## 2014-03-28 ENCOUNTER — Other Ambulatory Visit: Payer: Self-pay | Admitting: Family Medicine

## 2014-03-28 DIAGNOSIS — R5381 Other malaise: Secondary | ICD-10-CM

## 2014-03-28 DIAGNOSIS — K3189 Other diseases of stomach and duodenum: Secondary | ICD-10-CM

## 2014-03-28 DIAGNOSIS — Z9189 Other specified personal risk factors, not elsewhere classified: Secondary | ICD-10-CM

## 2014-03-28 DIAGNOSIS — R1013 Epigastric pain: Secondary | ICD-10-CM

## 2014-03-28 DIAGNOSIS — R7989 Other specified abnormal findings of blood chemistry: Secondary | ICD-10-CM

## 2014-03-28 DIAGNOSIS — R739 Hyperglycemia, unspecified: Secondary | ICD-10-CM

## 2014-03-28 DIAGNOSIS — Z2839 Other underimmunization status: Secondary | ICD-10-CM

## 2014-03-28 DIAGNOSIS — R5383 Other fatigue: Secondary | ICD-10-CM

## 2014-03-28 DIAGNOSIS — R7309 Other abnormal glucose: Secondary | ICD-10-CM

## 2014-03-28 LAB — CBC WITH DIFFERENTIAL/PLATELET
BASOS ABS: 0 10*3/uL (ref 0.0–0.1)
Basophils Relative: 0.6 % (ref 0.0–3.0)
EOS ABS: 0.1 10*3/uL (ref 0.0–0.7)
EOS PCT: 2 % (ref 0.0–5.0)
HCT: 46.1 % (ref 39.0–52.0)
Hemoglobin: 15.6 g/dL (ref 13.0–17.0)
Lymphocytes Relative: 35.6 % (ref 12.0–46.0)
Lymphs Abs: 2.1 10*3/uL (ref 0.7–4.0)
MCHC: 33.8 g/dL (ref 30.0–36.0)
MCV: 85.1 fl (ref 78.0–100.0)
Monocytes Absolute: 0.4 10*3/uL (ref 0.1–1.0)
Monocytes Relative: 7.3 % (ref 3.0–12.0)
NEUTROS PCT: 54.5 % (ref 43.0–77.0)
Neutro Abs: 3.2 10*3/uL (ref 1.4–7.7)
Platelets: 221 10*3/uL (ref 150.0–400.0)
RBC: 5.41 Mil/uL (ref 4.22–5.81)
RDW: 14 % (ref 11.5–14.6)
WBC: 6 10*3/uL (ref 4.5–10.5)

## 2014-03-28 LAB — HEMOGLOBIN A1C: Hgb A1c MFr Bld: 5.6 % (ref 4.6–6.5)

## 2014-03-28 LAB — HEPATIC FUNCTION PANEL
ALT: 25 U/L (ref 0–53)
AST: 19 U/L (ref 0–37)
Albumin: 3.9 g/dL (ref 3.5–5.2)
Alkaline Phosphatase: 52 U/L (ref 39–117)
BILIRUBIN DIRECT: 0.1 mg/dL (ref 0.0–0.3)
TOTAL PROTEIN: 7.3 g/dL (ref 6.0–8.3)
Total Bilirubin: 0.6 mg/dL (ref 0.3–1.2)

## 2014-03-28 LAB — TSH: TSH: 2.03 u[IU]/mL (ref 0.35–5.50)

## 2014-03-28 LAB — TESTOSTERONE: Testosterone: 274.16 ng/dL — ABNORMAL LOW (ref 350.00–890.00)

## 2014-03-28 LAB — VITAMIN B12: Vitamin B-12: 371 pg/mL (ref 211–911)

## 2014-03-29 LAB — RUBEOLA ANTIBODY IGG: Rubeola IgG: 36.1 AU/mL — ABNORMAL HIGH (ref <25.00)

## 2014-04-05 ENCOUNTER — Other Ambulatory Visit (INDEPENDENT_AMBULATORY_CARE_PROVIDER_SITE_OTHER): Payer: 59

## 2014-04-05 DIAGNOSIS — E291 Testicular hypofunction: Secondary | ICD-10-CM

## 2014-04-05 DIAGNOSIS — R7989 Other specified abnormal findings of blood chemistry: Secondary | ICD-10-CM

## 2014-04-05 LAB — FOLLICLE STIMULATING HORMONE: FSH: 7.7 m[IU]/mL (ref 1.4–18.1)

## 2014-04-05 LAB — LUTEINIZING HORMONE: LH: 4.22 m[IU]/mL (ref 1.50–9.30)

## 2014-04-06 ENCOUNTER — Encounter: Payer: Self-pay | Admitting: Family Medicine

## 2014-04-06 LAB — TESTOSTERONE, FREE, TOTAL, SHBG
SEX HORMONE BINDING: 29 nmol/L (ref 13–71)
TESTOSTERONE-% FREE: 2.1 % (ref 1.6–2.9)
TESTOSTERONE: 338 ng/dL (ref 300–890)
Testosterone, Free: 72.5 pg/mL (ref 47.0–244.0)

## 2014-04-24 ENCOUNTER — Ambulatory Visit (INDEPENDENT_AMBULATORY_CARE_PROVIDER_SITE_OTHER): Payer: 59

## 2014-04-24 DIAGNOSIS — Z23 Encounter for immunization: Secondary | ICD-10-CM

## 2014-05-09 ENCOUNTER — Encounter: Payer: Self-pay | Admitting: Family Medicine

## 2014-07-11 ENCOUNTER — Other Ambulatory Visit: Payer: Self-pay | Admitting: Family Medicine

## 2014-07-11 NOTE — Telephone Encounter (Signed)
plz phone in. 

## 2014-07-12 NOTE — Telephone Encounter (Signed)
Rx called in as directed.   

## 2014-08-30 ENCOUNTER — Encounter: Payer: Self-pay | Admitting: Family Medicine

## 2014-08-31 ENCOUNTER — Encounter: Payer: Self-pay | Admitting: Family Medicine

## 2014-08-31 MED ORDER — TRIAMCINOLONE ACETONIDE 0.1 % EX CREA: 1.0000 "application " | TOPICAL_CREAM | Freq: Two times a day (BID) | CUTANEOUS | Status: DC

## 2014-09-06 ENCOUNTER — Ambulatory Visit (INDEPENDENT_AMBULATORY_CARE_PROVIDER_SITE_OTHER): Payer: 59 | Admitting: Family Medicine

## 2014-09-06 ENCOUNTER — Encounter: Payer: Self-pay | Admitting: Family Medicine

## 2014-09-06 VITALS — BP 138/82 | HR 77 | Temp 98.0°F | Wt 209.5 lb

## 2014-09-06 DIAGNOSIS — E669 Obesity, unspecified: Secondary | ICD-10-CM

## 2014-09-06 DIAGNOSIS — R21 Rash and other nonspecific skin eruption: Secondary | ICD-10-CM

## 2014-09-06 MED ORDER — PHENTERMINE HCL 30 MG PO CAPS: 30.0000 mg | ORAL_CAPSULE | ORAL | Status: DC

## 2014-09-06 NOTE — Assessment & Plan Note (Addendum)
Reviewed risks and benefits of phentermine. Discussed possible side effects of med. Discussed importance of healthy diet/lifestyle changes. Will start phentermine 30mg  trial for next 1 month, f/u in 4-6 wks. Discussed change in diet choices over this time as well or he likely will not be successful in continued weight loss. Body mass index is 33.83 kg/(m^2).

## 2014-09-06 NOTE — Assessment & Plan Note (Signed)
After fire ant bite. Continue TCI cream for total 2 wks, start benadryl at night time for itch, start moisturizing cream emollient. Update if not improved with this.

## 2014-09-06 NOTE — Progress Notes (Signed)
   BP 138/82  Pulse 77  Temp(Src) 98 F (36.7 C) (Oral)  Wt 209 lb 8 oz (95.029 kg)  SpO2 97%   CC: rash  Subjective:    Patient ID: Nicholas Saunders, male    DOB: 1971/09/20, 43 y.o.   MRN: [id number]  HPI: Nicholas Saunders is a 43 y.o. male presenting on 09/06/2014 for Insect Bite   Rash - started Labor day while camping. Bitten throughout bilateral lower legs. Itchy rash developed after this. Self treated with hydrocortisone cream then we tried TCI cream tid. Slowly improving.  Obesity -  Difficulty with regular exercise with schedule. Does eat out regularly. Not healthy choices when eating out. Drinks 1 soda/day. Not enough water. Trouble with energy level.  Body mass index is 33.83 kg/(m^2). Wt Readings from Last 3 Encounters:  09/06/14 209 lb 8 oz (95.029 kg)  03/22/14 207 lb 8 oz (94.121 kg)  01/30/13 201 lb 8 oz (91.4 kg)    Relevant past medical, surgical, family and social history reviewed and updated as indicated.  Allergies and medications reviewed and updated. Current Outpatient Prescriptions on File Prior to Visit  Medication Sig  . ALPRAZolam (XANAX) 0.25 MG tablet TAKE ONE TABLET BY MOUTH AT BEDTIME AS NEEDED FOR ANXIETY  . fluticasone (FLONASE) 50 MCG/ACT nasal spray USE TWO SPRAYS IN EACH NOSTRIL DAILY  . sertraline (ZOLOFT) 50 MG tablet TAKE 1 TABLET BY MOUTH ONCE A DAY  . triamcinolone cream (KENALOG) 0.1 % Apply 1 application topically 2 (two) times daily. Apply to AA.   No current facility-administered medications on file prior to visit.    Review of Systems Per HPI unless specifically indicated above    Objective:    BP 138/82  Pulse 77  Temp(Src) 98 F (36.7 C) (Oral)  Wt 209 lb 8 oz (95.029 kg)  SpO2 97%  Physical Exam  Nursing note and vitals reviewed. Constitutional: He appears well-developed and well-nourished. No distress.  HENT:  Mouth/Throat: Oropharynx is clear and moist. No oropharyngeal exudate.  Cardiovascular: Normal rate, regular  rhythm, normal heart sounds and intact distal pulses.   No murmur heard. Pulmonary/Chest: Effort normal and breath sounds normal. No respiratory distress. He has no wheezes. He has no rales.  Musculoskeletal: He exhibits no edema.  Skin: Skin is warm and dry. Rash noted.  Pruritic papular rash LE with excoriations       Assessment & Plan:   Problem List Items Addressed This Visit   Skin rash - Primary     After fire ant bite. Continue TCI cream for total 2 wks, start benadryl at night time for itch, start moisturizing cream emollient. Update if not improved with this.    Obesity     Reviewed risks and benefits of phentermine. Discussed possible side effects of med. Discussed importance of healthy diet/lifestyle changes. Will start phentermine 30mg  trial for next 1 month, f/u in 4-6 wks. Discussed change in diet choices over this time as well or he likely will not be successful in continued weight loss. Body mass index is 33.83 kg/(m^2).    Relevant Medications      phentermine capsule       Follow up plan: Return in about 4 weeks (around 10/04/2014), or if symptoms worsen or fail to improve, for follow up visit.

## 2014-09-06 NOTE — Patient Instructions (Addendum)
Continue triamcinolone cream twice daily. Use benadryl 25mg  at bedtime for itch (may make you sleepy). Start moisturizing lotion between triamcinolone application. May try phentermine 30mg  one tablet daily - take in the morning. Watch for headache, chest pain, insomnia, or increased blood pressure. Return to see m in 4-6 weeks for follow up.

## 2014-09-06 NOTE — Progress Notes (Signed)
Pre visit review using our clinic review tool, if applicable. No additional management support is needed unless otherwise documented below in the visit note. 

## 2014-10-09 ENCOUNTER — Encounter: Payer: Self-pay | Admitting: Family Medicine

## 2014-10-09 ENCOUNTER — Ambulatory Visit (INDEPENDENT_AMBULATORY_CARE_PROVIDER_SITE_OTHER): Payer: 59 | Admitting: Family Medicine

## 2014-10-09 VITALS — BP 132/82 | HR 84 | Temp 98.2°F | Wt 203.0 lb

## 2014-10-09 DIAGNOSIS — E669 Obesity, unspecified: Secondary | ICD-10-CM

## 2014-10-09 MED ORDER — PHENTERMINE HCL 37.5 MG PO CAPS: 37.5000 mg | ORAL_CAPSULE | ORAL | Status: DC

## 2014-10-09 NOTE — Progress Notes (Signed)
   BP 132/82  Pulse 84  Temp(Src) 98.2 F (36.8 C) (Oral)  Wt 203 lb (92.08 kg)   CC: 4 wk f/u obesity Subjective:    Patient ID: Nicholas Saunders, male    DOB: 06/30/1971, 43 y.o.   MRN: [id number]  HPI: Nicholas Saunders is a 43 y.o. male presenting on 10/09/2014 for Follow-up   Seen here 09/06/2014 to discuss weight loss. We decided to start phentermine 30mg  for 1 month. Also reviewed healthy diet and lifestyle choices. Pt has had 6 lb weight loss. Denies headaches, chest pain, insomnia. Tolerating med well. Wt Readings from Last 3 Encounters:  10/09/14 203 lb (92.08 kg)  09/06/14 209 lb 8 oz (95.029 kg)  03/22/14 207 lb 8 oz (94.121 kg)   Body mass index is 32.78 kg/(m^2).  Hasn't made many diet changes other than increasing water intake. Increased walking to 1 hour 2-3 times a week around neighborhood - new golden doodle puppy.   More convenience food, less prepared meals. Trying to eat breakfast every morning. Lunch sandwich and chips. Down to 1 soda a day.  24hr hour recall: Breakfast 7:15am - omelet 2 eggs, spinach, cheese, 18oz coffee with cream and sugar Lunch 1:30pm - leftover spaghetti about 1/2 bowl with [place] and sauce and cheese with water Dinner 7:30pm - bowl of [place]un[place] stew from church with cheese bread and water  Relevant past medical, surgical, family and social history reviewed and updated as indicated.  Allergies and medications reviewed and updated. Current Outpatient Prescriptions on File Prior to Visit  Medication Sig  . ALPRAZolam (XANAX) 0.25 MG tablet TAKE ONE TABLET BY MOUTH AT BEDTIME AS NEEDED FOR ANXIETY  . fluticasone (FLONASE) 50 MCG/ACT nasal spray USE TWO SPRAYS IN EACH NOSTRIL DAILY  . sertraline (ZOLOFT) 50 MG tablet TAKE 1 TABLET BY MOUTH ONCE A DAY   No current facility-administered medications on file prior to visit.    Review of Systems Per HPI unless specifically indicated above    Objective:    BP 132/82  Pulse 84  Temp(Src) 98.2  F (36.8 C) (Oral)  Wt 203 lb (92.08 kg)  Physical Exam  Nursing note and vitals reviewed. Constitutional: He appears well-developed and well-nourished. No distress.  HENT:  Mouth/Throat: Oropharynx is clear and moist. No oropharyngeal exudate.  Eyes: Conjunctivae and EOM are normal. Pupils are equal, round, and reactive to light. No scleral icterus.  Cardiovascular: Normal rate, regular rhythm, normal heart sounds and intact distal pulses.   No murmur heard. Pulmonary/Chest: Effort normal and breath sounds normal. No respiratory distress. He has no wheezes. He has no rales.  Musculoskeletal: He exhibits no edema.       Assessment & Plan:   Problem List Items Addressed This Visit   Obesity - Primary     24 hour recall reviewed.  Encouraged increased fruits/vegetables in diet. Encouraged increased pace when walking, discussed ways to reach this goal. Ok to increase phentermine to 37.5mg  daily, return in 38mo for f/u. rtc 3 mo f/u visit Body mass index is 32.78 kg/(m^2).     Relevant Medications      phentermine capsule       Follow up plan: Return in about 3 months (around 01/09/2015), or as needed, for follow up visit.

## 2014-10-09 NOTE — Patient Instructions (Addendum)
Continue phentermine, may increase to 37.5mg  dose. May continue this over the next 3 months then return for recheck. Work on increased pace with walking  Work on more fruits/vegetables in diet.

## 2014-10-09 NOTE — Assessment & Plan Note (Addendum)
24 hour recall reviewed.  Encouraged increased fruits/vegetables in diet. Encouraged increased pace when walking, discussed ways to reach this goal. Ok to increase phentermine to 37.5mg  daily, return in 22mo for f/u. rtc 3 mo f/u visit Body mass index is 32.78 kg/(m^2).

## 2014-10-09 NOTE — Progress Notes (Signed)
Pre visit review using our clinic review tool, if applicable. No additional management support is needed unless otherwise documented below in the visit note. 

## 2015-01-10 ENCOUNTER — Encounter: Payer: Self-pay | Admitting: Family Medicine

## 2015-01-10 ENCOUNTER — Ambulatory Visit (INDEPENDENT_AMBULATORY_CARE_PROVIDER_SITE_OTHER): Payer: 59 | Admitting: Family Medicine

## 2015-01-10 VITALS — BP 124/80 | HR 84 | Temp 98.1°F | Wt 194.5 lb

## 2015-01-10 DIAGNOSIS — F32A Depression, unspecified: Secondary | ICD-10-CM

## 2015-01-10 DIAGNOSIS — F418 Other specified anxiety disorders: Secondary | ICD-10-CM

## 2015-01-10 DIAGNOSIS — F329 Major depressive disorder, single episode, unspecified: Secondary | ICD-10-CM

## 2015-01-10 DIAGNOSIS — E669 Obesity, unspecified: Secondary | ICD-10-CM

## 2015-01-10 DIAGNOSIS — L989 Disorder of the skin and subcutaneous tissue, unspecified: Secondary | ICD-10-CM | POA: Insufficient documentation

## 2015-01-10 DIAGNOSIS — F419 Anxiety disorder, unspecified: Secondary | ICD-10-CM

## 2015-01-10 MED ORDER — PHENTERMINE HCL 37.5 MG PO CAPS: 37.5000 mg | ORAL_CAPSULE | ORAL | Status: DC

## 2015-01-10 NOTE — Progress Notes (Signed)
BP 124/80 mmHg  Pulse 84  Temp(Src) 98.1 F (36.7 C) (Oral)  Wt 194 lb 8 oz (88.225 kg)   CC: 53mo f/u visit  Subjective:    Patient ID: Nicholas Saunders, male    DOB: [birth year], 44 y.o.   MRN: [id number]  HPI: Nicholas Saunders is a 44 y.o. male presenting on 01/10/2015 for Follow-up   See prior note for details. Last visit we continued phentermine and increased to 37.5mg  daily. We also encouraged increased walking, increased fruits/vegetables in diet. 9lb weight loss noted in last 3 months. This was with holidays and with exchange students from [place] staying with him. Has been splitting phentermine with wife - advised to stop doing this. Planning on restarting walking regimen. New puppy - 2nd golden doodle.   Tolerates phentermine well, no headaches or chest pain. Occasional dry mouth.   Anxiety - stable on sertraline 50mg  daily. On xanax for plane rides. Upcoming trip. Unsure if he has refill.   Would like spot on back checked - itchy, longstanding.  Relevant past medical, surgical, family and social history reviewed and updated as indicated. Interim medical history since our last visit reviewed. Allergies and medications reviewed and updated. Current Outpatient Prescriptions on File Prior to Visit  Medication Sig  . fluticasone (FLONASE) 50 MCG/ACT nasal spray USE TWO SPRAYS IN EACH NOSTRIL DAILY  . sertraline (ZOLOFT) 50 MG tablet TAKE 1 TABLET BY MOUTH ONCE A DAY  . ALPRAZolam (XANAX) 0.25 MG tablet TAKE ONE TABLET BY MOUTH AT BEDTIME AS NEEDED FOR ANXIETY (Patient not taking: Reported on 01/10/2015)   No current facility-administered medications on file prior to visit.    Review of Systems Per HPI unless specifically indicated above     Objective:    BP 124/80 mmHg  Pulse 84  Temp(Src) 98.1 F (36.7 C) (Oral)  Wt 194 lb 8 oz (88.225 kg)  Wt Readings from Last 3 Encounters:  01/10/15 194 lb 8 oz (88.225 kg)  10/09/14 203 lb (92.08 kg)  09/06/14 209 lb 8 oz (95.029  kg)    Physical Exam  Constitutional: He appears well-developed and well-nourished. No distress.  HENT:  Mouth/Throat: Oropharynx is clear and moist. No oropharyngeal exudate.  Eyes: Conjunctivae and EOM are normal. Pupils are equal, round, and reactive to light. No scleral icterus.  Neck: Normal range of motion. Neck supple. No thyromegaly present.  Cardiovascular: Normal rate, regular rhythm, normal heart sounds and intact distal pulses.   No murmur heard. Pulmonary/Chest: Effort normal and breath sounds normal. No respiratory distress. He has no wheezes. He has no rales.  Musculoskeletal: He exhibits no edema.  Lymphadenopathy:    He has no cervical adenopathy.  Skin: Skin is warm and dry. No rash noted.  Small benign SK on back  Psychiatric: He has a normal mood and affect.  Nursing note and vitals reviewed.      Assessment & Plan:   Problem List Items Addressed This Visit    Skin lesion - Primary    Benign SK      Obesity    Weight loss noted despite suboptimal effort. Encouraged rededicated efforts at healthy diet and active lifestyle. Pt thinks he could restart regular walking regimen. Advised not to share medication. Refilled phentermine 37.5mg  daily # 30 with RF 3 RTC 3 mo f/u and CPE\ Body mass index is 31.41 kg/(m^2).       Relevant Medications   phentermine capsule   Anxiety and depression    Stable on  zoloft - continue. Rare xanax prn flights.          Follow up plan: Return in about 3 months (around 04/11/2015), or as needed, for annual exam, prior fasting for blood work.

## 2015-01-10 NOTE — Progress Notes (Signed)
Pre visit review using our clinic review tool, if applicable. No additional management support is needed unless otherwise documented below in the visit note. 

## 2015-01-10 NOTE — Patient Instructions (Addendum)
Increase exercise into routine. Continue phentermine. Spot on back looking ok - seborrheic keratosis. Return in 3-4 months for physical

## 2015-01-10 NOTE — Assessment & Plan Note (Signed)
Benign SK

## 2015-01-10 NOTE — Assessment & Plan Note (Signed)
Stable on zoloft - continue. Rare xanax prn flights.

## 2015-01-10 NOTE — Assessment & Plan Note (Addendum)
Weight loss noted despite suboptimal effort. Encouraged rededicated efforts at healthy diet and active lifestyle. Pt thinks he could restart regular walking regimen. Advised not to share medication. Refilled phentermine 37.5mg  daily # 30 with RF 3 RTC 3 mo f/u and CPE\ Body mass index is 31.41 kg/(m^2).

## 2015-03-26 ENCOUNTER — Other Ambulatory Visit: Payer: Self-pay | Admitting: Family Medicine

## 2015-04-18 ENCOUNTER — Other Ambulatory Visit: Payer: Self-pay | Admitting: Family Medicine

## 2015-04-18 DIAGNOSIS — E669 Obesity, unspecified: Secondary | ICD-10-CM

## 2015-04-19 ENCOUNTER — Other Ambulatory Visit (INDEPENDENT_AMBULATORY_CARE_PROVIDER_SITE_OTHER): Payer: 59

## 2015-04-19 DIAGNOSIS — E669 Obesity, unspecified: Secondary | ICD-10-CM

## 2015-04-19 LAB — LIPID PANEL
CHOL/HDL RATIO: 4
Cholesterol: 164 mg/dL (ref 0–200)
HDL: 39.9 mg/dL (ref >39.00)
LDL CALC: 108 mg/dL — AB (ref 0–99)
NonHDL: 124.1
TRIGLYCERIDES: 79 mg/dL (ref 0.0–149.0)
VLDL: 15.8 mg/dL (ref 0.0–40.0)

## 2015-04-19 LAB — BASIC METABOLIC PANEL
BUN: 13 mg/dL (ref 6–23)
CHLORIDE: 105 meq/L (ref 96–112)
CO2: 27 meq/L (ref 19–32)
CREATININE: 1.12 mg/dL (ref 0.40–1.50)
Calcium: 9.2 mg/dL (ref 8.4–10.5)
GFR: 75.85 mL/min (ref >60.00)
Glucose, Bld: 103 mg/dL — ABNORMAL HIGH (ref 70–99)
Potassium: 3.7 mEq/L (ref 3.5–5.1)
Sodium: 139 mEq/L (ref 135–145)

## 2015-04-22 ENCOUNTER — Encounter: Payer: Self-pay | Admitting: Family Medicine

## 2015-04-22 ENCOUNTER — Ambulatory Visit (INDEPENDENT_AMBULATORY_CARE_PROVIDER_SITE_OTHER): Payer: 59 | Admitting: Family Medicine

## 2015-04-22 VITALS — BP 118/82 | HR 84 | Temp 98.2°F | Ht 66.0 in | Wt 191.5 lb

## 2015-04-22 DIAGNOSIS — Z0000 Encounter for general adult medical examination without abnormal findings: Secondary | ICD-10-CM | POA: Diagnosis not present

## 2015-04-22 DIAGNOSIS — F410 Panic disorder [episodic paroxysmal anxiety] without agoraphobia: Secondary | ICD-10-CM

## 2015-04-22 DIAGNOSIS — E669 Obesity, unspecified: Secondary | ICD-10-CM

## 2015-04-22 MED ORDER — PHENTERMINE HCL 37.5 MG PO CAPS: 37.5000 mg | ORAL_CAPSULE | ORAL | Status: DC

## 2015-04-22 NOTE — Progress Notes (Signed)
Pre visit review using our clinic review tool, if applicable. No additional management support is needed unless otherwise documented below in the visit note. 

## 2015-04-22 NOTE — Patient Instructions (Addendum)
You are doing well today - continue working on exercise routine. Return as needed or in 4-6 months for follow up

## 2015-04-22 NOTE — Assessment & Plan Note (Signed)
Body mass index is 30.92 kg/(m^2).  Continued weight loss noted. Congratulated. Refilled phentermine - tolerating well. Requests to continue current dose.

## 2015-04-22 NOTE — Assessment & Plan Note (Signed)
Preventative protocols reviewed and updated unless pt declined. Discussed healthy diet and lifestyle.  

## 2015-04-22 NOTE — Assessment & Plan Note (Signed)
Rare xanax use prn air travel.

## 2015-04-22 NOTE — Progress Notes (Signed)
BP 118/82 mmHg  Pulse 84  Temp(Src) 98.2 F (36.8 C) (Oral)  Ht 5\' 6"  (1.676 m)  Wt 191 lb 8 oz (86.864 kg)  BMI 30.92 kg/m2   CC: CPE  Subjective:    Patient ID: Nicholas Saunders, male    DOB: July 05, 1971, 44 y.o.   MRN: [id number]  HPI: Arsal Tappan is a 44 y.o. male presenting on 04/22/2015 for Annual Exam   Phentermine - some scalp itching.   Epigastric discomfort intermittently over last several years. No dysphagia, no early satiety. No GERD sxs. PPI didn't really help.   Preventative: Flu shot - did not receive Td 2006, Tdap 03/2014 Seat belt use discussed Sunscreen use discussed. No changing spots on skin, suspicious moles.  Caffeine: 3-4 caffeinated beverages Married. Lives with wife, 1 dog. 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 Interim medical history since our last visit reviewed. Allergies and medications reviewed and updated. Current Outpatient Prescriptions on File Prior to Visit  Medication Sig  . fluticasone (FLONASE) 50 MCG/ACT nasal spray USE TWO SPRAYS IN EACH NOSTRIL DAILY  . sertraline (ZOLOFT) 50 MG tablet TAKE 1 TABLET BY MOUTH DAILY  . ALPRAZolam (XANAX) 0.25 MG tablet TAKE ONE TABLET BY MOUTH AT BEDTIME AS NEEDED FOR ANXIETY (Patient not taking: Reported on 01/10/2015)   No current facility-administered medications on file prior to visit.    Review of Systems  Constitutional: Negative for fever, chills, activity change, appetite change, fatigue and unexpected weight change.  HENT: Negative for hearing loss.   Eyes: Negative for visual disturbance.  Respiratory: Negative for cough, chest tightness, shortness of breath and wheezing.   Cardiovascular: Negative for chest pain, palpitations and leg swelling.  Gastrointestinal: Negative for nausea, vomiting,  abdominal pain, diarrhea, constipation, blood in stool and abdominal distention.  Genitourinary: Negative for hematuria and difficulty urinating.  Musculoskeletal: Negative for myalgias, arthralgias and neck pain.  Skin: Negative for rash.  Neurological: Negative for dizziness, seizures, syncope and headaches.  Hematological: Negative for adenopathy. Does not bruise/bleed easily.  Psychiatric/Behavioral: Negative for dysphoric mood. The patient is not nervous/anxious.        Sertraline working well.   Per HPI unless specifically indicated above     Objective:    BP 118/82 mmHg  Pulse 84  Temp(Src) 98.2 F (36.8 C) (Oral)  Ht 5\' 6"  (1.676 m)  Wt 191 lb 8 oz (86.864 kg)  BMI 30.92 kg/m2  Wt Readings from Last 3 Encounters:  04/22/15 191 lb 8 oz (86.864 kg)  01/10/15 194 lb 8 oz (88.225 kg)  10/09/14 203 lb (92.08 kg)    Physical Exam  Constitutional: He is oriented to person, place, and time. He appears well-developed and well-nourished. No distress.  HENT:  Head: Normocephalic and atraumatic.  Right Ear: Hearing, tympanic membrane, external ear and ear canal normal.  Left Ear: Hearing, tympanic membrane, external ear and ear canal normal.  Nose: Nose normal.  Mouth/Throat: Uvula is midline, oropharynx is clear and moist and mucous membranes are normal. No oropharyngeal exudate, posterior oropharyngeal edema or posterior oropharyngeal erythema.  Eyes: Conjunctivae and EOM are normal. Pupils are equal, round, and reactive to light. No scleral icterus.  Neck: Normal range of motion. Neck supple. No thyromegaly present.  Cardiovascular: Normal rate, regular rhythm, normal heart sounds and intact distal pulses.   No murmur heard. Pulses:  Radial pulses are 2+ on the right side, and 2+ on the left side.  Pulmonary/Chest: Effort normal and breath sounds normal. No respiratory distress. He has no wheezes. He has no rales.  Abdominal: Soft. Bowel sounds are normal. He exhibits no  distension and no mass. There is no tenderness. There is no rebound and no guarding.  Musculoskeletal: Normal range of motion. He exhibits no edema.  Lymphadenopathy:    He has no cervical adenopathy.  Neurological: He is alert and oriented to person, place, and time.  CN grossly intact, station and gait intact  Skin: Skin is warm and dry. No rash noted.  Psychiatric: He has a normal mood and affect. His behavior is normal. Judgment and thought content normal.  Nursing note and vitals reviewed.  Results for orders placed or performed in visit on 04/19/15  Lipid panel  Result Value Ref Range   Cholesterol 164 0 - 200 mg/dL   Triglycerides 79.0 0.0 - 149.0 mg/dL   HDL 39.90 >39.00 mg/dL   VLDL 15.8 0.0 - 40.0 mg/dL   LDL Cholesterol 108 (H) 0 - 99 mg/dL   Total CHOL/HDL Ratio 4    NonHDL 254.98   Basic metabolic panel  Result Value Ref Range   Sodium 139 135 - 145 mEq/L   Potassium 3.7 3.5 - 5.1 mEq/L   Chloride 105 96 - 112 mEq/L   CO2 27 19 - 32 mEq/L   Glucose, Bld 103 (H) 70 - 99 mg/dL   BUN 13 6 - 23 mg/dL   Creatinine, Ser 1.12 0.40 - 1.50 mg/dL   Calcium 9.2 8.4 - 10.5 mg/dL   GFR 75.85 >60.00 mL/min      Assessment & Plan:   Problem List Items Addressed This Visit    PANIC DISORDER    Rare xanax use prn air travel.      Obesity    Body mass index is 30.92 kg/(m^2).  Continued weight loss noted. Congratulated. Refilled phentermine - tolerating well. Requests to continue current dose.      Relevant Medications   phentermine 37.5 MG capsule   Healthcare maintenance - Primary    Preventative protocols reviewed and updated unless pt declined. Discussed healthy diet and lifestyle.           Follow up plan: Return in about 4 months (around 08/23/2015), or as needed, for follow up visit.

## 2015-08-26 ENCOUNTER — Other Ambulatory Visit: Payer: Self-pay | Admitting: Family Medicine

## 2015-08-26 NOTE — Telephone Encounter (Signed)
Rx called in as directed.   

## 2015-08-26 NOTE — Telephone Encounter (Signed)
plz phone in. 

## 2015-08-30 ENCOUNTER — Encounter: Payer: Self-pay | Admitting: Family Medicine

## 2015-09-03 ENCOUNTER — Ambulatory Visit: Payer: 59 | Admitting: Family Medicine

## 2015-09-19 ENCOUNTER — Other Ambulatory Visit: Payer: Self-pay | Admitting: Family Medicine

## 2015-11-12 ENCOUNTER — Other Ambulatory Visit: Payer: Self-pay | Admitting: Family Medicine

## 2016-01-09 ENCOUNTER — Other Ambulatory Visit: Payer: Self-pay | Admitting: Family Medicine

## 2016-01-09 NOTE — Telephone Encounter (Signed)
Rx called in as directed.   

## 2016-01-09 NOTE — Telephone Encounter (Signed)
plz phone in. Due for phentermine f/u visit.

## 2016-05-01 ENCOUNTER — Other Ambulatory Visit: Payer: Self-pay | Admitting: Family Medicine

## 2016-05-01 NOTE — Telephone Encounter (Signed)
plz phone in. 

## 2016-05-01 NOTE — Telephone Encounter (Signed)
Rx called in to requested pharmacy 

## 2016-05-01 NOTE — Telephone Encounter (Signed)
Pt called back he needs the xanax for flying. He had a death in family and is flying out tonight @ 6 and would like rx before then Please advise when called

## 2016-05-01 NOTE — Telephone Encounter (Signed)
Notified pt refill done; pt voiced understanding and appreciative.

## 2016-05-01 NOTE — Telephone Encounter (Signed)
Last office visit 04/22/2015.  Last refilled

## 2016-06-01 ENCOUNTER — Encounter: Payer: Self-pay | Admitting: Family Medicine

## 2016-06-01 NOTE — Telephone Encounter (Signed)
plz call to schedule evaluation with available provider. Thanks.

## 2016-06-02 NOTE — Telephone Encounter (Signed)
Message left for patient to return my call and schedule appt.

## 2016-06-03 ENCOUNTER — Encounter: Payer: Self-pay | Admitting: Family Medicine

## 2016-06-03 ENCOUNTER — Ambulatory Visit (INDEPENDENT_AMBULATORY_CARE_PROVIDER_SITE_OTHER): Payer: 59 | Admitting: Family Medicine

## 2016-06-03 VITALS — BP 110/72 | HR 92 | Temp 98.6°F | Ht 66.0 in | Wt 201.5 lb

## 2016-06-03 DIAGNOSIS — R1011 Right upper quadrant pain: Secondary | ICD-10-CM | POA: Diagnosis not present

## 2016-06-03 LAB — BASIC METABOLIC PANEL
BUN: 19 mg/dL (ref 6–23)
CHLORIDE: 104 meq/L (ref 96–112)
CO2: 29 mEq/L (ref 19–32)
Calcium: 9.3 mg/dL (ref 8.4–10.5)
Creatinine, Ser: 1.12 mg/dL (ref 0.40–1.50)
GFR: 75.46 mL/min (ref >60.00)
Glucose, Bld: 117 mg/dL — ABNORMAL HIGH (ref 70–99)
POTASSIUM: 4.2 meq/L (ref 3.5–5.1)
Sodium: 137 mEq/L (ref 135–145)

## 2016-06-03 LAB — CBC WITH DIFFERENTIAL/PLATELET
Basophils Absolute: 0 10*3/uL (ref 0.0–0.1)
Basophils Relative: 0.6 % (ref 0.0–3.0)
EOS ABS: 0.1 10*3/uL (ref 0.0–0.7)
EOS PCT: 1.4 % (ref 0.0–5.0)
HEMATOCRIT: 45.6 % (ref 39.0–52.0)
HEMOGLOBIN: 15.3 g/dL (ref 13.0–17.0)
LYMPHS PCT: 32.7 % (ref 12.0–46.0)
Lymphs Abs: 2.3 10*3/uL (ref 0.7–4.0)
MCHC: 33.5 g/dL (ref 30.0–36.0)
MCV: 84.8 fl (ref 78.0–100.0)
Monocytes Absolute: 0.7 10*3/uL (ref 0.1–1.0)
Monocytes Relative: 9.8 % (ref 3.0–12.0)
Neutro Abs: 3.8 10*3/uL (ref 1.4–7.7)
Neutrophils Relative %: 55.5 % (ref 43.0–77.0)
Platelets: 238 10*3/uL (ref 150.0–400.0)
RBC: 5.37 Mil/uL (ref 4.22–5.81)
RDW: 13.8 % (ref 11.5–15.5)
WBC: 6.9 10*3/uL (ref 4.0–10.5)

## 2016-06-03 LAB — HEPATIC FUNCTION PANEL
ALBUMIN: 4.2 g/dL (ref 3.5–5.2)
ALT: 25 U/L (ref 0–53)
AST: 22 U/L (ref 0–37)
Alkaline Phosphatase: 59 U/L (ref 39–117)
BILIRUBIN DIRECT: 0.1 mg/dL (ref 0.0–0.3)
BILIRUBIN TOTAL: 0.5 mg/dL (ref 0.2–1.2)
Total Protein: 7 g/dL (ref 6.0–8.3)

## 2016-06-03 LAB — LIPASE: Lipase: 18 U/L (ref 11.0–59.0)

## 2016-06-03 NOTE — Progress Notes (Signed)
Pre visit review using our clinic review tool, if applicable. No additional management support is needed unless otherwise documented below in the visit note. 

## 2016-06-03 NOTE — Progress Notes (Signed)
Dr. Frederico Hamman T. Yesly Gerety, MD, [facility] [place], [address] Phone: [phone] Fax: [phone]  06/03/2016  Patient: Nicholas Saunders, MRN: [id number], DOB: May 03, 1971, 45 y.o.  Primary Physician:  Ria Bush, MD   Chief Complaint  Patient presents with  . Abdominal Pain    RUQ Pain   Subjective:   Nicholas Saunders is a 45 y.o. very pleasant male patient who presents with the following:  RUQ abd pain - right now it is not all that bad. If tightens up, will give some sharp pain. No fever.  No BRBPR and no melena.   Primarily this is all in the right upper quadrant, and at times it'll be bad, and other times will be all that bad.  He is not having any fevers, chills, sweats, and otherwise he is generally feeling okay.  No prior abdominal surgery.  No trauma or injury.  Immunization History  Administered Date(s) Administered  . Hep A / Hep B 03/23/2014, 04/24/2014  . Td 07/16/2005  . Tdap 03/22/2014      Past Medical History, Surgical History, Social History, Family History, Problem List, Medications, and Allergies have been reviewed and updated if relevant.  Patient Active Problem List   Diagnosis Date Noted  . Obesity 09/06/2014  . Dyspepsia 03/23/2014  . Fatigue 03/22/2014  . Anxiety and depression 12/31/2011  . Healthcare maintenance 06/19/2011  . ALLERGIC RHINITIS 04/20/2008  . PANIC DISORDER 07/19/2007  . PNEUMONIA, HX OF 07/14/2007    Past Medical History  Diagnosis Date  . H/O: pneumonia age 45-13  . H/O: Bell's palsy 2004  . Anxiety     plane rides    No past surgical history on file.  Social History   Social History  . Marital Status: Married    Spouse Name: N/A  . Number of Children: 0  . Years of Education: 12   Occupational History  . Theatre manager Other   Social History Main Topics  . Smoking status: Never Smoker   . Smokeless tobacco: Former Systems developer  . Alcohol Use:  No  . Drug Use: No  . Sexual Activity: Not on file   Other Topics Concern  . Not on file   Social History Narrative   Caffeine: 3-4 caffeinated beverages   Married.  Lives with wife, 1 dog.   No children   Theatre manager, works from home   Activity: started swimming, biking   Diet: water, tries to get fruits and vegetables   Edu: 12th grade    Family History  Problem Relation Age of Onset  . Alcohol abuse Father   . Cirrhosis Father     Alcohol induced  . Cancer Mother 53    esophageal (smoker)  . Alcohol abuse Brother   . Anxiety disorder Sister   . Stroke Maternal Grandfather   . Heart disease Maternal Grandfather   . Heart disease Maternal Grandmother     and MGGM  . Alcohol abuse Paternal Grandfather   . Diabetes Neg Hx     Allergies  Allergen Reactions  . Citalopram Hydrobromide Other (See Comments)    tachycardia    Medication list reviewed and updated in full in [place].   GEN: No acute illnesses, no fevers, chills. GI: No n/v/d, eating normally Pulm: No SOB Interactive and getting along well at home.  Otherwise, ROS is as per the HPI.  Objective:   BP 110/72 mmHg  Pulse 92  Temp(Src) 98.6 F (37 C) (Oral)  Ht 5\' 6"  (1.676 m)  Wt 201 lb 8 oz (91.4 kg)  BMI 32.54 kg/m2  GEN: WDWN, NAD, Non-toxic, A & O x 3 HEENT: Atraumatic, Normocephalic. Neck supple. No masses, No LAD. Ears and Nose: No external deformity. CV: RRR, No M/G/R. No JVD. No thrill. No extra heart sounds. PULM: CTA B, no wheezes, crackles, rhonchi. No retractions. No resp. distress. No accessory muscle use. ABD: S, RUQ > epigastric pain, ND, + BS, No rebound, No HSM  EXTR: No c/c/e NEURO Normal gait.  PSYCH: Normally interactive. Conversant. Not depressed or anxious appearing.  Calm demeanor.   Laboratory and Imaging Data: Results for orders placed or performed in visit on 123456  Basic metabolic panel  Result Value Ref Range   Sodium 137 135 - 145 mEq/L    Potassium 4.2 3.5 - 5.1 mEq/L   Chloride 104 96 - 112 mEq/L   CO2 29 19 - 32 mEq/L   Glucose, Bld 117 (H) 70 - 99 mg/dL   BUN 19 6 - 23 mg/dL   Creatinine, Ser 1.12 0.40 - 1.50 mg/dL   Calcium 9.3 8.4 - 10.5 mg/dL   GFR 75.46 >60.00 mL/min  CBC with Differential/Platelet  Result Value Ref Range   WBC 6.9 4.0 - 10.5 K/uL   RBC 5.37 4.22 - 5.81 Mil/uL   Hemoglobin 15.3 13.0 - 17.0 g/dL   HCT 45.6 39.0 - 52.0 %   MCV 84.8 78.0 - 100.0 fl   MCHC 33.5 30.0 - 36.0 g/dL   RDW 13.8 11.5 - 15.5 %   Platelets 238.0 150.0 - 400.0 K/uL   Neutrophils Relative % 55.5 43.0 - 77.0 %   Lymphocytes Relative 32.7 12.0 - 46.0 %   Monocytes Relative 9.8 3.0 - 12.0 %   Eosinophils Relative 1.4 0.0 - 5.0 %   Basophils Relative 0.6 0.0 - 3.0 %   Neutro Abs 3.8 1.4 - 7.7 K/uL   Lymphs Abs 2.3 0.7 - 4.0 K/uL   Monocytes Absolute 0.7 0.1 - 1.0 K/uL   Eosinophils Absolute 0.1 0.0 - 0.7 K/uL   Basophils Absolute 0.0 0.0 - 0.1 K/uL  Hepatic function panel  Result Value Ref Range   Total Bilirubin 0.5 0.2 - 1.2 mg/dL   Bilirubin, Direct 0.1 0.0 - 0.3 mg/dL   Alkaline Phosphatase 59 39 - 117 U/L   AST 22 0 - 37 U/L   ALT 25 0 - 53 U/L   Total Protein 7.0 6.0 - 8.3 g/dL   Albumin 4.2 3.5 - 5.2 g/dL  Lipase  Result Value Ref Range   Lipase 18.0 11.0 - 59.0 U/L     Assessment and Plan:   RUQ abdominal pain - Plan: Basic metabolic panel, CBC with Differential/Platelet, Hepatic function panel, Lipase, US Abdomen Limited RUQ  Basic laboratory workup is reassuring.  History and examination are suspicious for potential gallbladder disease.  Obtain a right upper quadrant ultrasound to evaluate for potential gallstones or potentially cholecystitis.  Follow-up: No Follow-up on file.   Orders Placed This Encounter  Procedures  . US Abdomen Limited RUQ  . Basic metabolic panel  . CBC with Differential/Platelet  . Hepatic function panel  . Lipase    Signed,  Ezzie Senat T. Khadejah Son, MD   Patient's  Medications  New Prescriptions   No medications on file  Previous Medications   ALPRAZOLAM (XANAX) 0.25 MG TABLET    TAKE 1 TABLET BY MOUTH EVERY NIGHT AT BEDTIME AS  NEEDED.   FLUTICASONE (FLONASE) 50 MCG/ACT NASAL SPRAY    USE 2 SPRAYS INTO EACH NOSTRIL ONCE DAILY AS DIRECTED.   PHENTERMINE (ADIPEX-P) 37.5 MG TABLET    TAKE ONE TABLET BY MOUTH EVERY MORNING   SERTRALINE (ZOLOFT) 50 MG TABLET    TAKE 1 TABLET BY MOUTH DAILY  Modified Medications   No medications on file  Discontinued Medications   No medications on file

## 2016-06-03 NOTE — Patient Instructions (Signed)

## 2016-06-05 ENCOUNTER — Ambulatory Visit
Admission: RE | Admit: 2016-06-05 | Discharge: 2016-06-05 | Disposition: A | Discharge status: Home / Self Care | Payer: 59 | Source: Ambulatory Visit | Attending: Family Medicine | Admitting: Family Medicine

## 2016-06-05 DIAGNOSIS — R1011 Right upper quadrant pain: Secondary | ICD-10-CM | POA: Insufficient documentation

## 2016-06-09 ENCOUNTER — Ambulatory Visit: Payer: Self-pay | Admitting: Family Medicine

## 2016-06-16 ENCOUNTER — Encounter: Payer: Self-pay | Admitting: Family Medicine

## 2016-06-17 MED ORDER — FLUTICASONE PROPIONATE 50 MCG/ACT NA SUSP: NASAL | Status: DC

## 2016-07-20 ENCOUNTER — Encounter: Payer: Self-pay | Admitting: Family Medicine

## 2016-07-20 ENCOUNTER — Other Ambulatory Visit: Payer: Self-pay | Admitting: Family Medicine

## 2016-07-22 ENCOUNTER — Encounter: Payer: Self-pay | Admitting: Family Medicine

## 2016-07-23 ENCOUNTER — Ambulatory Visit (INDEPENDENT_AMBULATORY_CARE_PROVIDER_SITE_OTHER): Payer: 59 | Admitting: Family Medicine

## 2016-07-23 ENCOUNTER — Encounter: Payer: Self-pay | Admitting: Family Medicine

## 2016-07-23 VITALS — BP 130/84 | HR 64 | Temp 98.4°F | Wt 204.5 lb

## 2016-07-23 DIAGNOSIS — Z9989 Dependence on other enabling machines and devices: Secondary | ICD-10-CM

## 2016-07-23 DIAGNOSIS — R0683 Snoring: Secondary | ICD-10-CM | POA: Diagnosis not present

## 2016-07-23 DIAGNOSIS — S29019A Strain of muscle and tendon of unspecified wall of thorax, initial encounter: Secondary | ICD-10-CM

## 2016-07-23 DIAGNOSIS — M25532 Pain in left wrist: Secondary | ICD-10-CM | POA: Diagnosis not present

## 2016-07-23 DIAGNOSIS — E66811 Obesity, class 1: Secondary | ICD-10-CM

## 2016-07-23 DIAGNOSIS — G4733 Obstructive sleep apnea (adult) (pediatric): Secondary | ICD-10-CM | POA: Insufficient documentation

## 2016-07-23 DIAGNOSIS — S2341XA Sprain of ribs, initial encounter: Secondary | ICD-10-CM | POA: Diagnosis not present

## 2016-07-23 DIAGNOSIS — IMO0002 Reserved for concepts with insufficient information to code with codable children: Secondary | ICD-10-CM | POA: Insufficient documentation

## 2016-07-23 DIAGNOSIS — E669 Obesity, unspecified: Secondary | ICD-10-CM

## 2016-07-23 MED ORDER — NAPROXEN 500 MG PO TABS
ORAL_TABLET | ORAL | 0 refills | Status: DC
Start: 2016-07-23 — End: 2016-12-21

## 2016-07-23 MED ORDER — PHENTERMINE HCL 37.5 MG PO TABS: 37.5000 mg | ORAL_TABLET | Freq: Every morning | ORAL | 1 refills | Status: DC

## 2016-07-23 MED ORDER — METHOCARBAMOL 500 MG PO TABS: 500.0000 mg | ORAL_TABLET | Freq: Three times a day (TID) | ORAL | 0 refills | Status: DC | PRN

## 2016-07-23 NOTE — Patient Instructions (Addendum)
ESS today.  I think you have bad rib strain - treat with naprsoyn twice daily with food for 5 days then as needed, use robaxin muscle relaxant as well (sedation precautions). If not better, let me know for rib xray.  For left wrist - I think you have triangular fibrocartilage complex strain. Naprosyn should help. If persistent discomfort, use wrist brace for 2 wks.

## 2016-07-23 NOTE — Progress Notes (Signed)
Pre visit review using our clinic review tool, if applicable. No additional management support is needed unless otherwise documented below in the visit note. 

## 2016-07-23 NOTE — Progress Notes (Signed)
BP 130/84   Pulse 64   Temp 98.4 F (36.9 C) (Oral)   Wt 204 lb 8 oz (92.8 kg)   BMI 33.01 kg/m    CC: several issues Subjective:    Patient ID: Nicholas Saunders, male    DOB: Jul 25, 1971, 45 y.o.   MRN: [id number]  HPI: Nicholas Saunders is a 45 y.o. male presenting on 07/23/2016 for Follow-up (abd pain, wrist, snoring)   See recent pt email.  Left wrist pain - intermittent tenderness to lateral ulnar wrist for months. Denies inciting trauma/injury. Hasn't tried anything for this yet. No redness/swelling/warmth. No paresthesias.   RUQ abd discomfort - ongoing for last 2+ months. Worse pain with sneezing, bending forward. Pain similar to pain you feel after running with stitch in side. Reproducible. No fevers/chills, nausea/vomiting or diarrhea. Denies inciting trauma/injury or falls. Previous labwork and abd [place] reassuring. Denies GERD symptoms. Not food related.   Snoring - several month history of increased snoring. Wakes wife up. She states he stops breathing at night time and some choking which wakes him up as well. Weight gain noted. Non-restorative sleeping. Averages 7-8 hours sleep. No significant daytime sleepiness. Drinks 1-2 caffeinated beverages/day. H/o deviated septum since he was hit with softball on left side of nose. He does use flonase at night time. Looking into [employer]. Endorses longstanding difficulty breathing out of left nostril with congestion.   Relevant past medical, surgical, family and social history reviewed and updated as indicated. Interim medical history since our last visit reviewed. Allergies and medications reviewed and updated. Current Outpatient Prescriptions on File Prior to Visit  Medication Sig  . fluticasone (FLONASE) 50 MCG/ACT nasal spray USE 2 SPRAYS INTO EACH NOSTRIL ONCE DAILY AS DIRECTED.  Marland Kitchen sertraline (ZOLOFT) 50 MG tablet TAKE 1 TABLET BY MOUTH DAILY  . ALPRAZolam (XANAX) 0.25 MG tablet TAKE 1 TABLET BY MOUTH EVERY NIGHT AT BEDTIME AS  NEEDED. (Patient not taking: Reported on 07/23/2016)   No current facility-administered medications on file prior to visit.     Review of Systems Per HPI unless specifically indicated in ROS section     Objective:    BP 130/84   Pulse 64   Temp 98.4 F (36.9 C) (Oral)   Wt 204 lb 8 oz (92.8 kg)   BMI 33.01 kg/m   Wt Readings from Last 3 Encounters:  07/23/16 204 lb 8 oz (92.8 kg)  06/03/16 201 lb 8 oz (91.4 kg)  04/22/15 191 lb 8 oz (86.9 kg)    Physical Exam  Constitutional: He appears well-developed and well-nourished. No distress.  HENT:  Mouth/Throat: Oropharynx is clear and moist. No oropharyngeal exudate.  Eyes: Conjunctivae and EOM are normal. Pupils are equal, round, and reactive to light.  Neck: Normal range of motion. Neck supple.  Cardiovascular: Normal rate, regular rhythm, normal heart sounds and intact distal pulses.   No murmur heard. Pulmonary/Chest: Effort normal and breath sounds normal. No respiratory distress. He has no wheezes. He has no rales. He exhibits tenderness.  Some point tenderness to palpation anterior right lower ribcage, worse with sitting up from supine position  Abdominal: Soft. Bowel sounds are normal. He exhibits no distension and no mass. There is no tenderness. There is no rebound and no guarding.  Musculoskeletal: He exhibits no edema.  FROM bilateral wrists Tender to palpation L lateral ulnar wrist just proximal to carpals without swelling, erythema, warmth  Skin: Skin is warm and dry. No rash noted.  Nursing note and vitals reviewed.  Results for orders placed or performed in visit on 123456  Basic metabolic panel  Result Value Ref Range   Sodium 137 135 - 145 mEq/L   Potassium 4.2 3.5 - 5.1 mEq/L   Chloride 104 96 - 112 mEq/L   CO2 29 19 - 32 mEq/L   Glucose, Bld 117 (H) 70 - 99 mg/dL   BUN 19 6 - 23 mg/dL   Creatinine, Ser 1.12 0.40 - 1.50 mg/dL   Calcium 9.3 8.4 - 10.5 mg/dL   GFR 75.46 >60.00 mL/min  CBC with  Differential/Platelet  Result Value Ref Range   WBC 6.9 4.0 - 10.5 K/uL   RBC 5.37 4.22 - 5.81 Mil/uL   Hemoglobin 15.3 13.0 - 17.0 g/dL   HCT 45.6 39.0 - 52.0 %   MCV 84.8 78.0 - 100.0 fl   MCHC 33.5 30.0 - 36.0 g/dL   RDW 13.8 11.5 - 15.5 %   Platelets 238.0 150.0 - 400.0 K/uL   Neutrophils Relative % 55.5 43.0 - 77.0 %   Lymphocytes Relative 32.7 12.0 - 46.0 %   Monocytes Relative 9.8 3.0 - 12.0 %   Eosinophils Relative 1.4 0.0 - 5.0 %   Basophils Relative 0.6 0.0 - 3.0 %   Neutro Abs 3.8 1.4 - 7.7 K/uL   Lymphs Abs 2.3 0.7 - 4.0 K/uL   Monocytes Absolute 0.7 0.1 - 1.0 K/uL   Eosinophils Absolute 0.1 0.0 - 0.7 K/uL   Basophils Absolute 0.0 0.0 - 0.1 K/uL  Hepatic function panel  Result Value Ref Range   Total Bilirubin 0.5 0.2 - 1.2 mg/dL   Bilirubin, Direct 0.1 0.0 - 0.3 mg/dL   Alkaline Phosphatase 59 39 - 117 U/L   AST 22 0 - 37 U/L   ALT 25 0 - 53 U/L   Total Protein 7.0 6.0 - 8.3 g/dL   Albumin 4.2 3.5 - 5.2 g/dL  Lipase  Result Value Ref Range   Lipase 18.0 11.0 - 59.0 U/L      Assessment & Plan:   Problem List Items Addressed This Visit    Left wrist pain    Anticipate mild TFCC strain - treat with naprosyn. 2 wks with wrist brace if no improvement.       Obesity, Class I, BMI 30-34.9    Requests phentermine refill - refilled, advised f/u in office for refills. Body mass index is 33.01 kg/m.       Relevant Medications   phentermine (ADIPEX-P) 37.5 MG tablet   Snoring    ESS today = 8 although pt denies significant daytime sleepiness.  Will refer to pulm for further eval of sleep apnea.  Exam consistent with deviated septum which could contribute.       Relevant Orders   Ambulatory referral to Pulmonology   Sprain and strain of ribs - Primary    Exam most consistent with MSK cause of RUQ pain - reproducible pain that is positional. Anticipate ongoing R anterior ribcage strain. Treat with naprosyn and robaxin. Update if persistent pain to consider  rib xray. Pt agrees with plan.       Other Visit Diagnoses   None.      Follow up plan: Return if symptoms worsen or fail to improve.  Ria Bush, MD

## 2016-07-23 NOTE — Telephone Encounter (Signed)
Appt scheduled

## 2016-07-23 NOTE — Assessment & Plan Note (Signed)
Exam most consistent with MSK cause of RUQ pain - reproducible pain that is positional. Anticipate ongoing R anterior ribcage strain. Treat with naprosyn and robaxin. Update if persistent pain to consider rib xray. Pt agrees with plan.

## 2016-07-23 NOTE — Assessment & Plan Note (Signed)
Requests phentermine refill - refilled, advised f/u in office for refills. Body mass index is 33.01 kg/m.

## 2016-07-23 NOTE — Assessment & Plan Note (Signed)
ESS today = 8 although pt denies significant daytime sleepiness.  Will refer to pulm for further eval of sleep apnea.  Exam consistent with deviated septum which could contribute.

## 2016-07-23 NOTE — Assessment & Plan Note (Signed)
Anticipate mild TFCC strain - treat with naprosyn. 2 wks with wrist brace if no improvement.

## 2016-07-23 NOTE — Telephone Encounter (Signed)
Seeing if patient can come in today at 1:30pm.

## 2016-08-04 ENCOUNTER — Encounter: Payer: Self-pay | Admitting: Internal Medicine

## 2016-08-04 ENCOUNTER — Ambulatory Visit (INDEPENDENT_AMBULATORY_CARE_PROVIDER_SITE_OTHER): Payer: 59 | Admitting: Internal Medicine

## 2016-08-04 VITALS — BP 132/80 | HR 87 | Ht 66.0 in | Wt 204.0 lb

## 2016-08-04 DIAGNOSIS — G4730 Sleep apnea, unspecified: Secondary | ICD-10-CM

## 2016-08-04 DIAGNOSIS — G4719 Other hypersomnia: Secondary | ICD-10-CM

## 2016-08-04 NOTE — Progress Notes (Signed)
[facility] Consultation      Date: 08/04/2016,   MRN# [id number] Nicholas Saunders 1971-01-05 Code Status:  Code Status History    This patient does not have a recorded code status. Please follow your organizational policy for patients in this situation.     Hosp day:@LENGTHOFSTAYDAYS @ Referring MD: @ATDPROV @     PCP:      AdmissionWeight: 204 lb (92.5 kg)                 CurrentWeight: 204 lb (92.5 kg) Nicholas Saunders is a 45 y.o. old male seen in consultation for sleep problems at the request of Dr. Christean Grief.     CHIEF COMPLAINT:   Excessive fatigue and tiredness during day   HISTORY OF PRESENT ILLNESS   45 yo pleasant white male seen today for problems sleeping with loud snoring, this has been going on for many years Patient stated by wife to have very loud snoring, associated with choking at night and gasping for air-that happens often Patient has NON-refreshed sleep in AM, goes to bed b/w 1030PM -12AM, takes about 20 mins to fall asleep Patient has gained 15 pounds in in past 3 years EPWORTH sleep score 11  Patient is non smoker, non ETOH abuse, no drug abuse noted Takes Zoloft and xanax as needed for anxiety Has associated with  complains of excessive fatigue and tiredness ben going on for a long time Patient does not exercise frequently   Has been having RT sided Upper quadrant pain-initermittent, relieved by naprosyn, GB work up in progress No signs of infection  He complains     PAST MEDICAL HISTORY   Past Medical History:  Diagnosis Date  . Anxiety    plane rides  . H/O: Bell's palsy 2004  . H/O: pneumonia age [age]     SURGICAL HISTORY   History reviewed. No pertinent surgical history.   FAMILY HISTORY   Family History  Problem Relation Age of Onset  . Alcohol abuse Father   . Cirrhosis Father     Alcohol induced  . Cancer Mother 21    esophageal (smoker)  . Alcohol abuse Brother   . Anxiety disorder Sister   .  Stroke Maternal Grandfather   . Heart disease Maternal Grandfather   . Heart disease Maternal Grandmother     and MGGM  . Alcohol abuse Paternal Grandfather   . Diabetes Neg Hx      SOCIAL HISTORY   Social History  Substance Use Topics  . Smoking status: Never Smoker  . Smokeless tobacco: Former Systems developer    Types: Snuff  . Alcohol use No     MEDICATIONS    Home Medication:  Current Outpatient Rx  . Order #: [id number] Class: Phone In  . Order #: [id number] Class: Normal  . Order #: [id number] Class: Normal  . Order #: [id number] Class: Normal  . Order #: [id number] Class: Print  . Order #: [id number] Class: Normal    Current Medication:  Current Outpatient Prescriptions:  .  ALPRAZolam (XANAX) 0.25 MG tablet, TAKE 1 TABLET BY MOUTH EVERY NIGHT AT BEDTIME AS NEEDED., Disp: 10 tablet, Rfl: 0 .  fluticasone (FLONASE) 50 MCG/ACT nasal spray, USE 2 SPRAYS INTO EACH NOSTRIL ONCE DAILY AS DIRECTED., Disp: 16 g, Rfl: 1 .  methocarbamol (ROBAXIN) 500 MG tablet, Take 1 tablet (500 mg total) by mouth 3 (three) times daily as needed for muscle spasms., Disp: 40 tablet, Rfl: 0 .  naproxen (NAPROSYN) 500 MG tablet, Take one po bid x 5  days then prn pain, take with food, Disp: 40 tablet, Rfl: 0 .  phentermine (ADIPEX-P) 37.5 MG tablet, Take 1 tablet (37.5 mg total) by mouth every morning., Disp: 30 tablet, Rfl: 1 .  sertraline (ZOLOFT) 50 MG tablet, TAKE 1 TABLET BY MOUTH DAILY, Disp: 90 tablet, Rfl: 3    ALLERGIES   Citalopram hydrobromide     REVIEW OF SYSTEMS   Review of Systems  Constitutional: Positive for malaise/fatigue. Negative for chills, diaphoresis, fever and weight loss.  HENT: Negative for congestion and hearing loss.   Eyes: Negative for blurred vision and double vision.  Respiratory: Negative for cough, hemoptysis, sputum production, shortness of breath and wheezing.   Cardiovascular: Negative for chest pain, palpitations and orthopnea.  Gastrointestinal: Positive for  abdominal pain. Negative for blood in stool, constipation, diarrhea, heartburn, nausea and vomiting.  Genitourinary: Negative for dysuria and urgency.  Musculoskeletal: Negative for back pain, myalgias and neck pain.  Skin: Negative for rash.  Neurological: Negative for dizziness, tingling, tremors, weakness and headaches.  Endo/Heme/Allergies: Does not bruise/bleed easily.  Psychiatric/Behavioral: Negative for depression, substance abuse and suicidal ideas.  All other systems reviewed and are negative.    VS: BP 132/80 (BP Location: Left Arm, Cuff Size: Normal)   Pulse 87   Ht 5\' 6"  (1.676 m)   Wt 204 lb (92.5 kg)   SpO2 97%   BMI 32.93 kg/m      PHYSICAL EXAM  Physical Exam  Constitutional: He is oriented to person, place, and time. He appears well-developed and well-nourished. No distress.  HENT:  Head: Normocephalic and atraumatic.  Mouth/Throat: No oropharyngeal exudate.  Eyes: EOM are normal. Pupils are equal, round, and reactive to light. No scleral icterus.  Neck: Normal range of motion. Neck supple.  Cardiovascular: Normal rate, regular rhythm and normal heart sounds.   No murmur heard. Pulmonary/Chest: No stridor. No respiratory distress. He has no wheezes.  Abdominal: Soft. Bowel sounds are normal. He exhibits no distension. There is no tenderness. There is no rebound.  Musculoskeletal: Normal range of motion. He exhibits no edema.  Neurological: He is alert and oriented to person, place, and time. No cranial nerve deficit.  Skin: Skin is warm. He is not diaphoretic.  Psychiatric: He has a normal mood and affect.          ASSESSMENT/PLAN   45 yo pleasant overweight white male with signs and symptoms of fatigue,excessive daytime sleepiness and loud snoring with choking episodes at night, likely related to underlying OSA  1.needs Split night sleep study ASAP 2.diet and exercise and weight loss recommended  Follow up in 3 months after test  completed    The Patient requires high complexity decision making for assessment and support, frequent evaluation and titration of therapies, application of advanced monitoring technologies and extensive interpretation of multiple databases.   Patient satisfied with Plan of action and management. All questions answered  Corrin Parker, M.D.  [facility]  Medical Director [place] Director [facility] Cardio-Pulmonary Department

## 2016-08-04 NOTE — Patient Instructions (Signed)
Obtain sleep study  Sleep Apnea  Sleep apnea is a sleep disorder characterized by abnormal pauses in breathing while you sleep. When your breathing pauses, the level of oxygen in your blood decreases. This causes you to move out of deep sleep and into light sleep. As a result, your quality of sleep is poor, and the system that carries your blood throughout your body (cardiovascular system) experiences stress. If sleep apnea remains untreated, the following conditions can develop:  High blood pressure (hypertension).  Coronary artery disease.  Inability to achieve or maintain an erection (impotence).  Impairment of your thought process (cognitive dysfunction). There are three types of sleep apnea: 1. Obstructive sleep apnea--Pauses in breathing during sleep because of a blocked airway. 2. Central sleep apnea--Pauses in breathing during sleep because the area of the brain that controls your breathing does not send the correct signals to the muscles that control breathing. 3. Mixed sleep apnea--A combination of both obstructive and central sleep apnea. RISK FACTORS The following risk factors can increase your risk of developing sleep apnea:  Being overweight.  Smoking.  Having narrow passages in your nose and throat.  Being of older age.  Being male.  Alcohol use.  Sedative and tranquilizer use.  Ethnicity. Among individuals younger than 35 years, African Americans are at increased risk of sleep apnea. SYMPTOMS   Difficulty staying asleep.  Daytime sleepiness and fatigue.  Loss of energy.  Irritability.  Loud, heavy snoring.  Morning headaches.  Trouble concentrating.  Forgetfulness.  Decreased interest in sex.  Unexplained sleepiness. DIAGNOSIS  In order to diagnose sleep apnea, your caregiver will perform a physical examination. A sleep study done in the comfort of your own home may be appropriate if you are otherwise healthy. Your caregiver may also recommend  that you spend the night in a sleep lab. In the sleep lab, several monitors record information about your heart, lungs, and brain while you sleep. Your leg and arm movements and blood oxygen level are also recorded. TREATMENT The following actions may help to resolve mild sleep apnea:  Sleeping on your side.   Using a decongestant if you have nasal congestion.   Avoiding the use of depressants, including alcohol, sedatives, and narcotics.   Losing weight and modifying your diet if you are overweight. There also are devices and treatments to help open your airway:  Oral appliances. These are custom-made mouthpieces that shift your lower jaw forward and slightly open your bite. This opens your airway.  Devices that create positive airway pressure. This positive pressure "splints" your airway open to help you breathe better during sleep. The following devices create positive airway pressure:  Continuous positive airway pressure (CPAP) device. The CPAP device creates a continuous level of air pressure with an air pump. The air is delivered to your airway through a mask while you sleep. This continuous pressure keeps your airway open.  Nasal expiratory positive airway pressure (EPAP) device. The EPAP device creates positive air pressure as you exhale. The device consists of single-use valves, which are inserted into each nostril and held in place by adhesive. The valves create very little resistance when you inhale but create much more resistance when you exhale. That increased resistance creates the positive airway pressure. This positive pressure while you exhale keeps your airway open, making it easier to breath when you inhale again.  Bilevel positive airway pressure (BPAP) device. The BPAP device is used mainly in patients with central sleep apnea. This device is similar to  the CPAP device because it also uses an air pump to deliver continuous air pressure through a mask. However, with the  BPAP machine, the pressure is set at two different levels. The pressure when you exhale is lower than the pressure when you inhale.  Surgery. Typically, surgery is only done if you cannot comply with less invasive treatments or if the less invasive treatments do not improve your condition. Surgery involves removing excess tissue in your airway to create a wider passage way.   This information is not intended to replace advice given to you by your health care provider. Make sure you discuss any questions you have with your health care provider.   Document Released: 11/20/2002 Document Revised: 12/21/2014 Document Reviewed: 04/07/2012 Elsevier Interactive Patient Education [employer].

## 2016-09-10 ENCOUNTER — Ambulatory Visit: Payer: 59 | Attending: Internal Medicine

## 2016-09-10 DIAGNOSIS — F419 Anxiety disorder, unspecified: Secondary | ICD-10-CM | POA: Insufficient documentation

## 2016-09-10 DIAGNOSIS — G4733 Obstructive sleep apnea (adult) (pediatric): Secondary | ICD-10-CM | POA: Diagnosis not present

## 2016-09-10 DIAGNOSIS — Z79899 Other long term (current) drug therapy: Secondary | ICD-10-CM | POA: Diagnosis not present

## 2016-09-10 DIAGNOSIS — F329 Major depressive disorder, single episode, unspecified: Secondary | ICD-10-CM | POA: Insufficient documentation

## 2016-09-10 DIAGNOSIS — G4719 Other hypersomnia: Secondary | ICD-10-CM | POA: Diagnosis not present

## 2016-09-10 DIAGNOSIS — R0683 Snoring: Secondary | ICD-10-CM | POA: Diagnosis present

## 2016-09-10 DIAGNOSIS — G2581 Restless legs syndrome: Secondary | ICD-10-CM | POA: Diagnosis not present

## 2016-09-16 ENCOUNTER — Other Ambulatory Visit: Payer: Self-pay | Admitting: Family Medicine

## 2016-09-16 DIAGNOSIS — R0683 Snoring: Secondary | ICD-10-CM | POA: Diagnosis not present

## 2016-09-18 ENCOUNTER — Telehealth: Payer: Self-pay | Admitting: *Deleted

## 2016-09-18 DIAGNOSIS — G4733 Obstructive sleep apnea (adult) (pediatric): Secondary | ICD-10-CM

## 2016-09-18 NOTE — Telephone Encounter (Signed)
LMOM for pt to return call in regards to sleep study results.

## 2016-09-18 NOTE — Telephone Encounter (Signed)
-----   Message from Laverle Hobby, MD sent at 09/16/2016  4:03 PM EDT ----- Regarding: sleep study results Sleep study positive for OSA with AHI of 14, will need  titration study.

## 2016-09-18 NOTE — Telephone Encounter (Signed)
Pt informed of sleep study results and that order for titration study will be placed. Order placed. Nothing further needed.

## 2016-09-29 ENCOUNTER — Ambulatory Visit: Payer: 59 | Attending: Internal Medicine

## 2016-09-29 DIAGNOSIS — G4733 Obstructive sleep apnea (adult) (pediatric): Secondary | ICD-10-CM | POA: Diagnosis not present

## 2016-10-02 DIAGNOSIS — G4733 Obstructive sleep apnea (adult) (pediatric): Secondary | ICD-10-CM | POA: Diagnosis not present

## 2016-10-06 IMAGING — US US ABDOMEN LIMITED
1 series · 14 of 25 positions shown · non-contrast
Comparison: None.

CLINICAL DATA: Right upper quadrant pain.

EXAM:
US ABDOMEN LIMITED - RIGHT UPPER QUADRANT

[Series 1: us abdomen limited · 0.16mm/px · 14 of 52 slices shown]
[im 1/52]
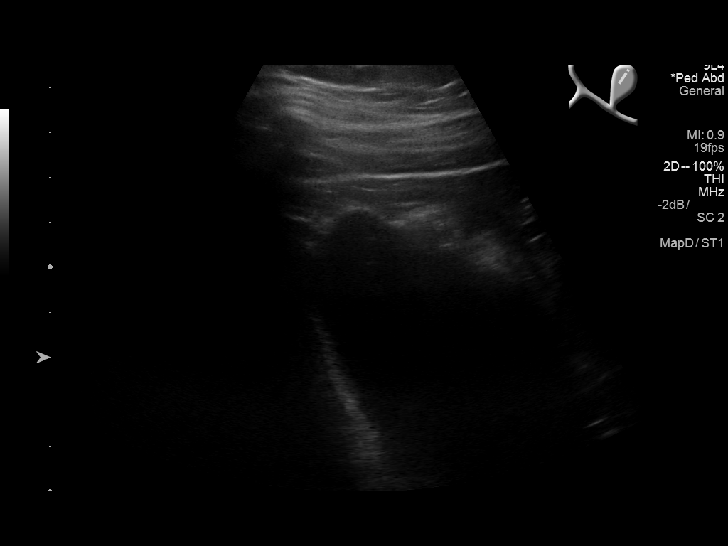
[im 5/52]
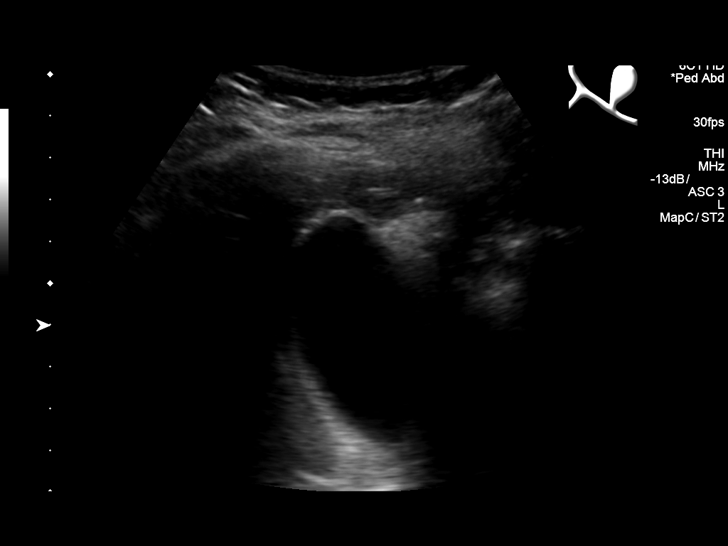
[im 9/52]
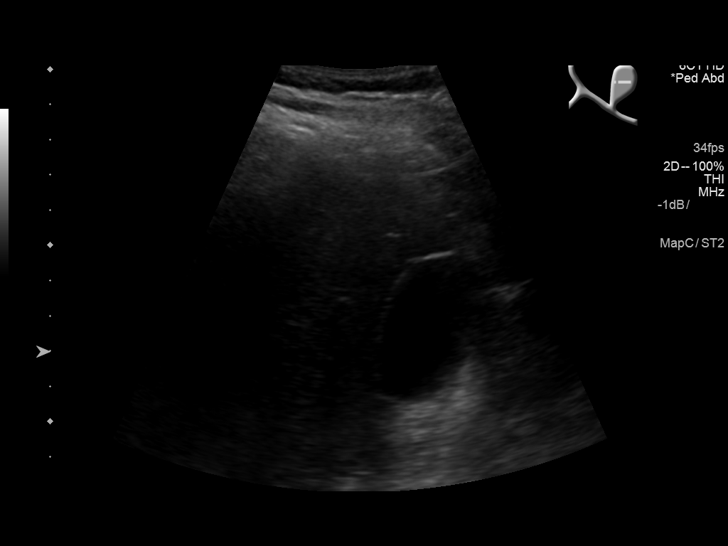
[im 13/52]
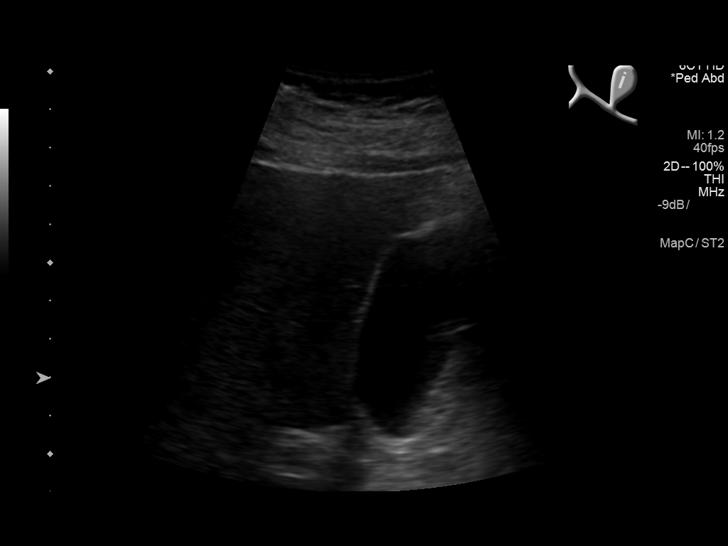
[im 18/52]
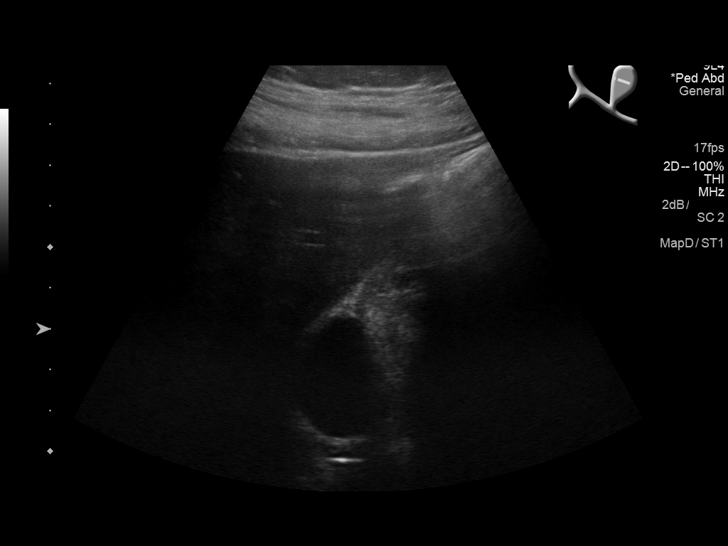
[im 20/52]
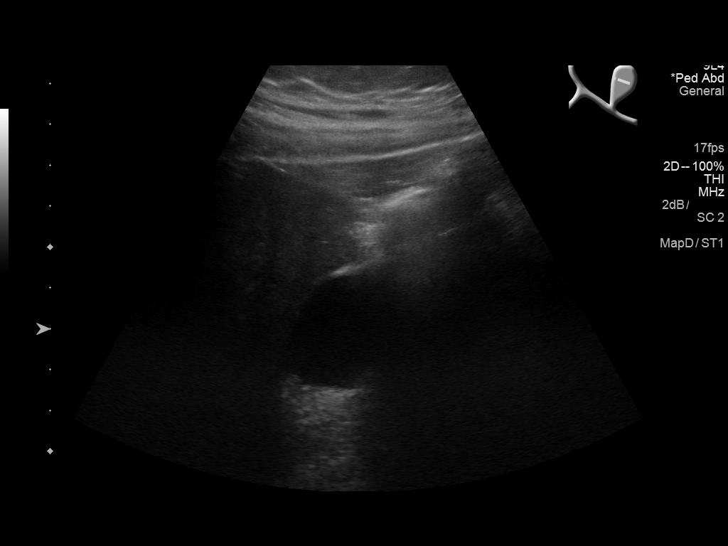
[im 24/52]
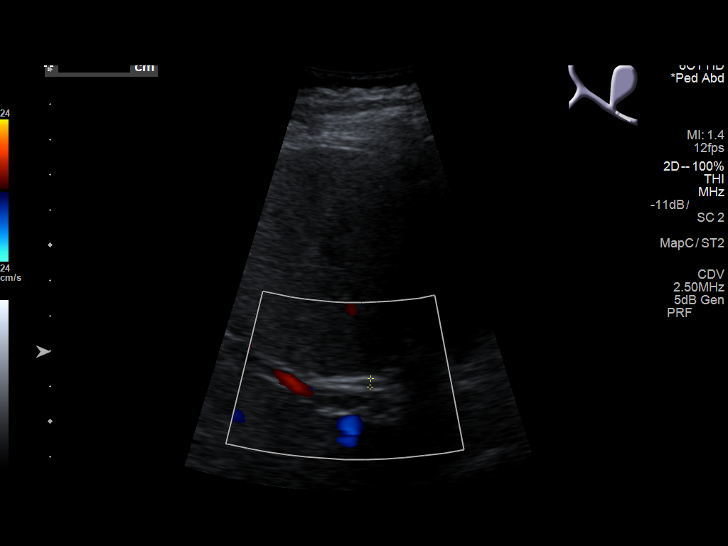
[im 28/52]
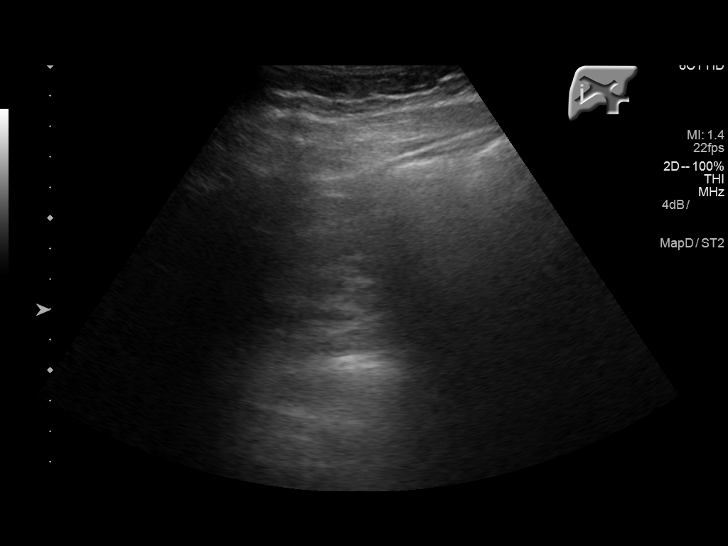
[im 32/52]
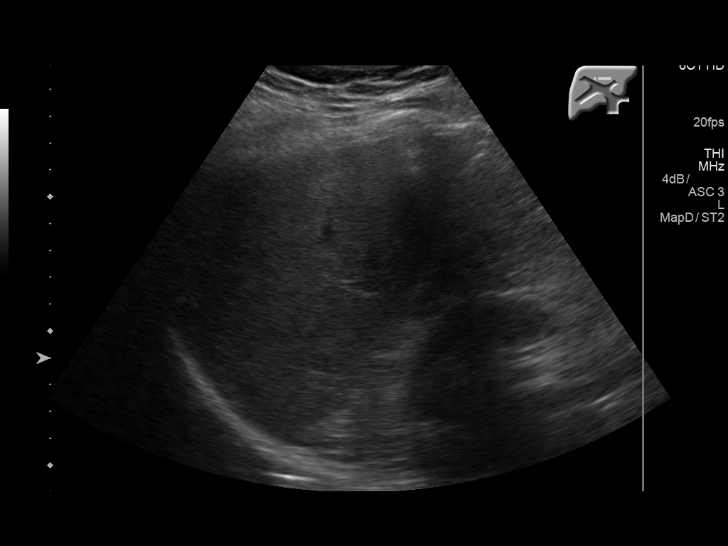
[im 35/52]
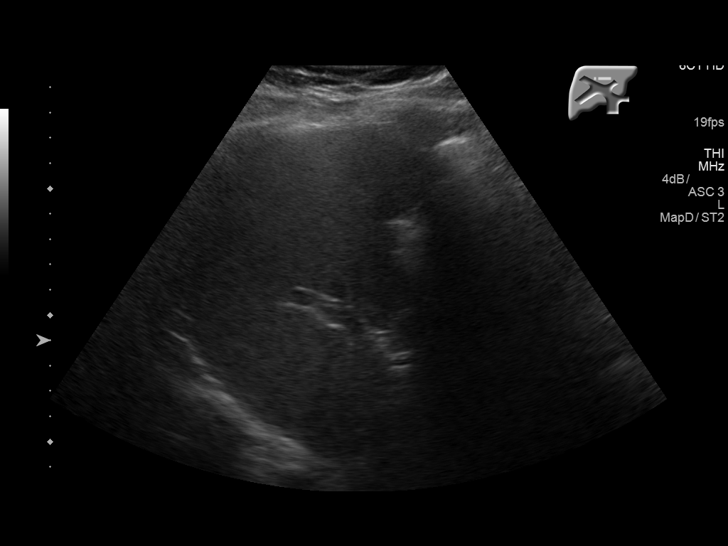
[im 39/52]
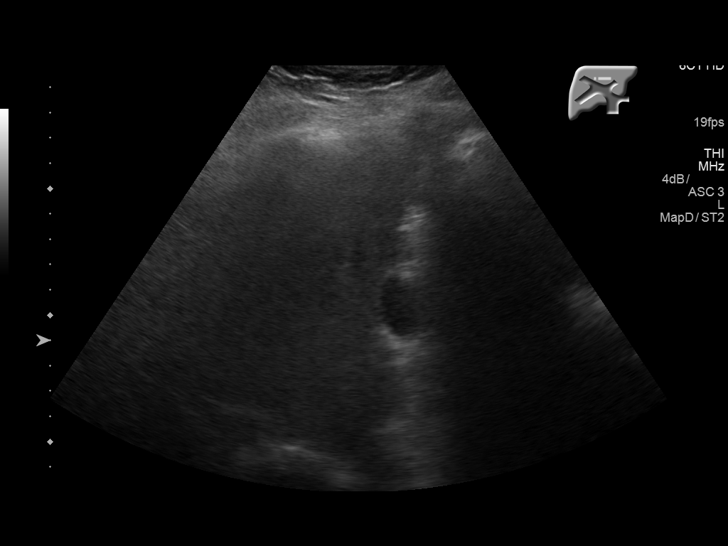
[im 43/52]
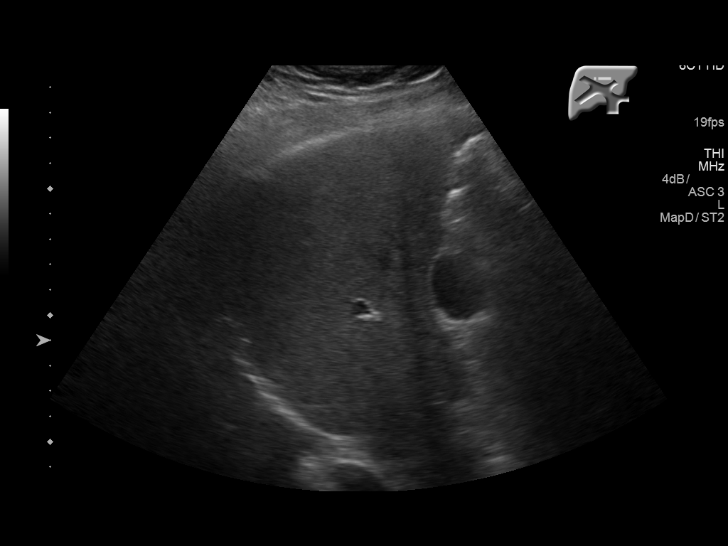
[im 47/52]
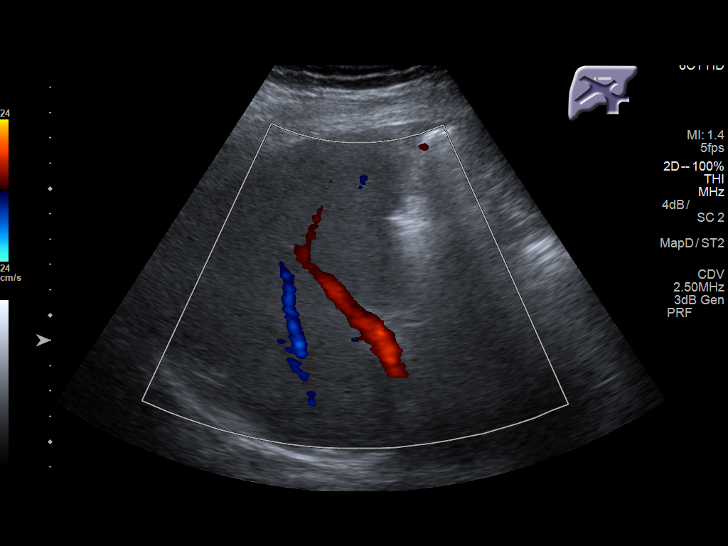
[im 52/52]
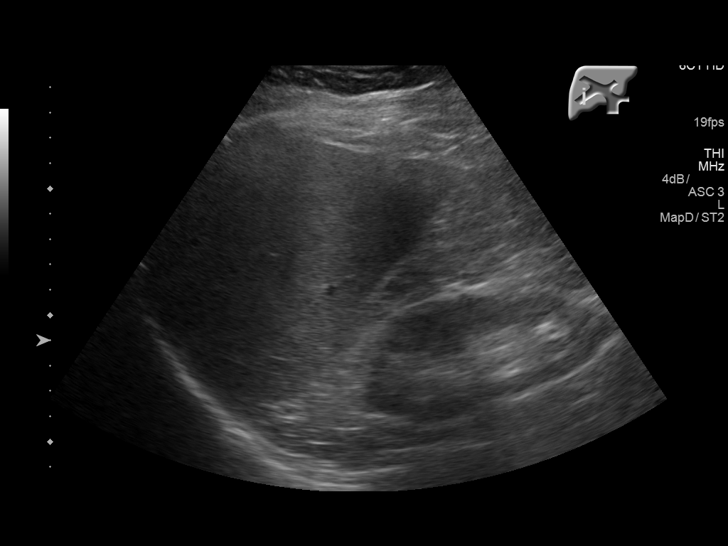

[14 of 25 positions shown; findings below may reference images not displayed]

FINDINGS: Gallbladder:

No gallstones or wall thickening visualized. No sonographic Murphy
sign noted by sonographer.

Common bile duct:

Diameter: 2.8 mm

Liver:

No focal lesion identified. Within normal limits in parenchymal
echogenicity. Limited visualization due to bowel gas.
IMPRESSION: No acute or focal abnormality.

## 2016-10-07 ENCOUNTER — Telehealth: Payer: Self-pay | Admitting: *Deleted

## 2016-10-07 DIAGNOSIS — G4733 Obstructive sleep apnea (adult) (pediatric): Secondary | ICD-10-CM

## 2016-10-07 NOTE — Telephone Encounter (Signed)
Pt informed we have received titration results and will place an order for the CPAP. Order placed. Nothing further needed.

## 2016-10-20 ENCOUNTER — Other Ambulatory Visit: Payer: Self-pay | Admitting: Family Medicine

## 2016-11-11 ENCOUNTER — Encounter: Payer: Self-pay | Admitting: Family Medicine

## 2016-11-11 ENCOUNTER — Encounter: Payer: Self-pay | Admitting: Internal Medicine

## 2016-11-12 ENCOUNTER — Encounter: Payer: Self-pay | Admitting: Internal Medicine

## 2016-12-14 DIAGNOSIS — G4733 Obstructive sleep apnea (adult) (pediatric): Secondary | ICD-10-CM | POA: Diagnosis not present

## 2016-12-21 ENCOUNTER — Encounter: Payer: Self-pay | Admitting: Internal Medicine

## 2016-12-21 ENCOUNTER — Ambulatory Visit (INDEPENDENT_AMBULATORY_CARE_PROVIDER_SITE_OTHER): Payer: 59 | Admitting: Internal Medicine

## 2016-12-21 VITALS — BP 128/80 | HR 77 | Wt 218.0 lb

## 2016-12-21 DIAGNOSIS — G4733 Obstructive sleep apnea (adult) (pediatric): Secondary | ICD-10-CM | POA: Diagnosis not present

## 2016-12-21 NOTE — Patient Instructions (Signed)
Follow up in 6 months 

## 2016-12-21 NOTE — Progress Notes (Signed)
[facility] Consultation      Date: 12/21/2016,   MRN# [id number] Nicholas Saunders 1971/11/28 Code Status:  Code Status History    This patient does not have a recorded code status. Please follow your organizational policy for patients in this situation.     Hosp day:@LENGTHOFSTAYDAYS @ Referring MD: @ATDPROV @     PCP:      Admission                  Current  Nicholas Saunders is a 46 y.o. old male seen in consultation for sleep problems at the request of Dr. Christean Saunders.     CHIEF COMPLAINT:   Follow up OSA   HISTORY OF PRESENT ILLNESS   46 yo pleasant white male seen today for follow up OSA Patient is 1000% compliant AHI less than 1 Tiredness much improved    Patient is non smoker, non ETOH abuse, no drug abuse noted Takes Zoloft and xanax as needed for anxiety Patient does not exercise frequently   No signs of infection at this time  He complains    Current Medication:  Current Outpatient Prescriptions:  .  ALPRAZolam (XANAX) 0.25 MG tablet, TAKE 1 TABLET BY MOUTH EVERY NIGHT AT BEDTIME AS NEEDED., Disp: 10 tablet, Rfl: 0 .  fluticasone (FLONASE) 50 MCG/ACT nasal spray, USE 2 SPRAYS INTO EACH NOSTRIL ONCE DAILY AS DIRECTED., Disp: 16 g, Rfl: 3 .  methocarbamol (ROBAXIN) 500 MG tablet, Take 1 tablet (500 mg total) by mouth 3 (three) times daily as needed for muscle spasms., Disp: 40 tablet, Rfl: 0 .  naproxen (NAPROSYN) 500 MG tablet, Take one po bid x 5 days then prn pain, take with food, Disp: 40 tablet, Rfl: 0 .  phentermine (ADIPEX-P) 37.5 MG tablet, Take 1 tablet (37.5 mg total) by mouth every morning., Disp: 30 tablet, Rfl: 1 .  sertraline (ZOLOFT) 50 MG tablet, TAKE 1 TABLET BY MOUTH DAILY, Disp: 90 tablet, Rfl: 0    ALLERGIES   Citalopram hydrobromide     REVIEW OF SYSTEMS   Review of Systems  Constitutional: Positive for malaise/fatigue. Negative for chills, diaphoresis, fever and weight loss.  HENT: Negative for congestion and  hearing loss.   Eyes: Negative for blurred vision and double vision.  Respiratory: Negative for cough, shortness of breath and wheezing.   Cardiovascular: Negative for chest pain, palpitations and orthopnea.  Gastrointestinal: Negative for abdominal pain, heartburn, nausea and vomiting.  Genitourinary: Negative for dysuria and urgency.  Skin: Negative for rash.  Neurological: Negative for tremors and weakness.  Endo/Heme/Allergies: Does not bruise/bleed easily.  All other systems reviewed and are negative.    VS: There were no vitals taken for this visit.     PHYSICAL EXAM  Physical Exam  Constitutional: He is oriented to person, place, and time. He appears well-developed and well-nourished. No distress.  HENT:  Head: Normocephalic and atraumatic.  Mouth/Throat: No oropharyngeal exudate.  Neck: Neck supple.  Cardiovascular: Normal rate, regular rhythm and normal heart sounds.   No murmur heard. Pulmonary/Chest: Effort normal and breath sounds normal. No stridor. No respiratory distress. He has no wheezes.  Abdominal: Soft. Bowel sounds are normal.  Musculoskeletal: Normal range of motion. He exhibits no edema.  Neurological: He is alert and oriented to person, place, and time. No cranial nerve deficit.  Skin: Skin is warm. He is not diaphoretic.  Psychiatric: He has a normal mood and affect.       ASSESSMENT/PLAN   46 yo pleasant overweight  white male with signs and symptoms of fatigue,excessive daytime sleepiness and loud snoring with choking with  OSA  1.continue OSA therapy 2.diet and exercise and weight loss recommended  Follow up in 6  months    The Patient requires high complexity decision making for assessment and support, frequent evaluation and titration of therapies, application of advanced monitoring technologies and extensive interpretation of multiple databases.   Patient satisfied with Plan of action and management. All questions answered  Nicholas Saunders, M.D.  [facility]  Medical Director [place] Director [facility] Cardio-Pulmonary Department

## 2017-01-01 ENCOUNTER — Encounter: Payer: Self-pay | Admitting: Internal Medicine

## 2017-01-14 ENCOUNTER — Encounter: Payer: Self-pay | Admitting: Internal Medicine

## 2017-01-14 DIAGNOSIS — G4733 Obstructive sleep apnea (adult) (pediatric): Secondary | ICD-10-CM | POA: Diagnosis not present

## 2017-01-18 ENCOUNTER — Other Ambulatory Visit: Payer: Self-pay | Admitting: Family Medicine

## 2017-01-19 ENCOUNTER — Encounter: Payer: Self-pay | Admitting: Family Medicine

## 2017-01-19 ENCOUNTER — Ambulatory Visit (INDEPENDENT_AMBULATORY_CARE_PROVIDER_SITE_OTHER): Payer: 59 | Admitting: Family Medicine

## 2017-01-19 VITALS — BP 120/78 | HR 84 | Temp 98.9°F | Wt 216.8 lb

## 2017-01-19 DIAGNOSIS — B9789 Other viral agents as the cause of diseases classified elsewhere: Secondary | ICD-10-CM | POA: Diagnosis not present

## 2017-01-19 DIAGNOSIS — J069 Acute upper respiratory infection, unspecified: Secondary | ICD-10-CM

## 2017-01-19 DIAGNOSIS — G4733 Obstructive sleep apnea (adult) (pediatric): Secondary | ICD-10-CM | POA: Diagnosis not present

## 2017-01-19 MED ORDER — GUAIFENESIN-CODEINE 100-10 MG/5ML PO SYRP: 5.0000 mL | ORAL_SOLUTION | Freq: Three times a day (TID) | ORAL | 0 refills | Status: DC | PRN

## 2017-01-19 NOTE — Patient Instructions (Addendum)
You have a viral upper respiratory infection. Antibiotics are not needed for this.  Viral infections usually take 7-10 days to resolve.  The cough can last a few weeks to go away.  Use medication as prescribed: cheratussin codeine cough syrup for night time.  Push fluids and plenty of rest. Ibuprofen 400-600mg  with meals.  Please return if you are not improving as expected, or if you have high fevers (>101.5) or difficulty swallowing or worsening productive cough. Call clinic with questions.  Good to see you today. I hope you start feeling better soon.

## 2017-01-19 NOTE — Telephone Encounter (Signed)
I offered pt appt today at 4:30pm or tomorrow at 11am.  Can we check and see if he's interested in being seen for URI?

## 2017-01-19 NOTE — Progress Notes (Signed)
   BP 120/78   Pulse 84   Temp 98.9 F (37.2 C) (Oral)   Wt 216 lb 12 oz (98.3 kg)   SpO2 96%   BMI 34.98 kg/m    CC: URI Subjective:    Patient ID: Nicholas Saunders, male    DOB: [birth year], 46 y.o.   MRN: [id number]  HPI: Nicholas Saunders is a 46 y.o. male presenting on 01/19/2017 for URI (cough,congestion x4 days)   4d h/o cough, congestion, headache, Tmax 101.8 on Sunday. Body aches.   No ear or tooth pain, ST, PNdrainage.   Treating with theraflu, other OTC remedies.   No sick contacts at home. No flu shot.  No h/o asthma.   Relevant past medical, surgical, family and social history reviewed and updated as indicated. Interim medical history since our last visit reviewed. Allergies and medications reviewed and updated. Current Outpatient Prescriptions on File Prior to Visit  Medication Sig  . ALPRAZolam (XANAX) 0.25 MG tablet TAKE 1 TABLET BY MOUTH EVERY NIGHT AT BEDTIME AS NEEDED.  . fluticasone (FLONASE) 50 MCG/ACT nasal spray USE 2 SPRAYS INTO EACH NOSTRIL ONCE DAILY AS DIRECTED.  Marland Kitchen sertraline (ZOLOFT) 50 MG tablet TAKE 1 TABLET BY MOUTH DAILY   No current facility-administered medications on file prior to visit.     Review of Systems Per HPI unless specifically indicated in ROS section     Objective:    BP 120/78   Pulse 84   Temp 98.9 F (37.2 C) (Oral)   Wt 216 lb 12 oz (98.3 kg)   SpO2 96%   BMI 34.98 kg/m   Wt Readings from Last 3 Encounters:  01/19/17 216 lb 12 oz (98.3 kg)  12/21/16 218 lb (98.9 kg)  08/04/16 204 lb (92.5 kg)    Physical Exam  Constitutional: He appears well-developed and well-nourished. No distress.  HENT:  Head: Normocephalic and atraumatic.  Right Ear: Hearing, external ear and ear canal normal.  Left Ear: Hearing, external ear and ear canal normal.  Nose: Mucosal edema present. No rhinorrhea. Right sinus exhibits no maxillary sinus tenderness and no frontal sinus tenderness. Left sinus exhibits no maxillary sinus tenderness  and no frontal sinus tenderness.  Mouth/Throat: Uvula is midline and mucous membranes are normal. Posterior oropharyngeal erythema present. No oropharyngeal exudate, posterior oropharyngeal edema or tonsillar abscesses.  Fluid behind TMs bilaterally Nasal mucosal erythema  Eyes: Conjunctivae and EOM are normal. Pupils are equal, round, and reactive to light. No scleral icterus.  Neck: Normal range of motion. Neck supple.  Cardiovascular: Normal rate, regular rhythm, normal heart sounds and intact distal pulses.   No murmur heard. Pulmonary/Chest: Effort normal and breath sounds normal. No respiratory distress. He has no wheezes. He has no rales.  Lymphadenopathy:    He has no cervical adenopathy.  Skin: Skin is warm and dry. No rash noted.  Nursing note and vitals reviewed.     Assessment & Plan:   Problem List Items Addressed This Visit    Viral URI with cough - Primary    Anticipate viral given short duration vs flu like. Not true influenza. rec supportive care. Discussed why abx not indicated at this time.  Push fluids and rest  cheratussin for cough.  Red flags to update [place] discussed.          Follow up plan: Return if symptoms worsen or fail to improve.  Ria Bush, MD

## 2017-01-19 NOTE — Progress Notes (Signed)
Pre visit review using our clinic review tool, if applicable. No additional management support is needed unless otherwise documented below in the visit note. 

## 2017-01-19 NOTE — Telephone Encounter (Signed)
Appt scheduled for today.

## 2017-01-19 NOTE — Assessment & Plan Note (Addendum)
Anticipate viral given short duration vs flu like. Not true influenza. rec supportive care. Discussed why abx not indicated at this time.  Push fluids and rest  cheratussin for cough.  Red flags to update [place] discussed.

## 2017-01-21 ENCOUNTER — Encounter: Payer: Self-pay | Admitting: Family Medicine

## 2017-01-28 ENCOUNTER — Other Ambulatory Visit: Payer: Self-pay | Admitting: Family Medicine

## 2017-02-11 DIAGNOSIS — G4733 Obstructive sleep apnea (adult) (pediatric): Secondary | ICD-10-CM | POA: Diagnosis not present

## 2017-03-14 DIAGNOSIS — G4733 Obstructive sleep apnea (adult) (pediatric): Secondary | ICD-10-CM | POA: Diagnosis not present

## 2017-04-13 DIAGNOSIS — G4733 Obstructive sleep apnea (adult) (pediatric): Secondary | ICD-10-CM | POA: Diagnosis not present

## 2017-05-14 DIAGNOSIS — G4733 Obstructive sleep apnea (adult) (pediatric): Secondary | ICD-10-CM | POA: Diagnosis not present

## 2017-05-24 ENCOUNTER — Other Ambulatory Visit: Payer: Self-pay | Admitting: Family Medicine

## 2017-05-24 ENCOUNTER — Other Ambulatory Visit: Payer: Self-pay | Admitting: Internal Medicine

## 2017-05-24 NOTE — Telephone Encounter (Signed)
Last CPE 04/22/2015, no appt sch.  Rx filled 01/28/17 #90 0 refills.

## 2017-06-13 DIAGNOSIS — G4733 Obstructive sleep apnea (adult) (pediatric): Secondary | ICD-10-CM | POA: Diagnosis not present

## 2017-07-14 DIAGNOSIS — G4733 Obstructive sleep apnea (adult) (pediatric): Secondary | ICD-10-CM | POA: Diagnosis not present

## 2017-10-06 DIAGNOSIS — G4733 Obstructive sleep apnea (adult) (pediatric): Secondary | ICD-10-CM | POA: Diagnosis not present

## 2017-11-16 ENCOUNTER — Ambulatory Visit: Payer: 59 | Admitting: Internal Medicine

## 2017-11-16 ENCOUNTER — Encounter: Payer: Self-pay | Admitting: Internal Medicine

## 2017-11-16 VITALS — BP 128/78 | HR 82 | Ht 62.0 in | Wt 217.0 lb

## 2017-11-16 DIAGNOSIS — G4733 Obstructive sleep apnea (adult) (pediatric): Secondary | ICD-10-CM | POA: Diagnosis not present

## 2017-11-16 NOTE — Progress Notes (Signed)
Nicholas [facility]      Date: 11/16/2017,   MRN# [id number] Nicholas [facility] [birth year] Code Status:  Code Status History    This patient does not have a recorded code status. Please follow your organizational policy for patients in this situation.        AdmissionWeight: 217 lb (98.4 kg)                 CurrentWeight: 217 lb (98.4 kg) Nicholas [facility] is a 46 y.o. old male seen in [facility] for sleep problems at the request of Dr. Christean Grief.     CHIEF COMPLAINT:   Follow up OSA   HISTORY OF PRESENT ILLNESS   46 yo pleasant white male seen today for follow up OSA Patient is 97% compliant AHI less than 1 Tiredness much improved  Patient uses and benefits from CPAP therapy at 10cm h20   Patient is non smoker, non ETOH abuse, no drug abuse noted Takes Zoloft and xanax as needed for anxiety Patient does not exercise frequently   No signs of infection at this time  He complains    Current Medication:  Current Outpatient Medications:  .  ALPRAZolam (XANAX) 0.25 MG tablet, TAKE 1 TABLET BY MOUTH EVERY NIGHT AT BEDTIME AS NEEDED., Disp: 10 tablet, Rfl: 0 .  fluticasone (FLONASE) 50 MCG/ACT nasal spray, USE 2 SPRAYS INTO EACH NOSTRIL ONCE DAILY AS DIRECTED., Disp: 16 g, Rfl: 5 .  sertraline (ZOLOFT) 50 MG tablet, TAKE 1 TABLET BY MOUTH DAILY, Disp: 90 tablet, Rfl: 1    ALLERGIES   Citalopram hydrobromide     REVIEW OF SYSTEMS   Review of Systems  Constitutional: Positive for malaise/fatigue. Negative for chills, diaphoresis, fever and weight loss.  HENT: Negative for congestion and hearing loss.   Eyes: Negative for blurred vision and double vision.  Respiratory: Negative for cough, shortness of breath and wheezing.   Cardiovascular: Negative for chest pain, palpitations and orthopnea.  Gastrointestinal: Negative for abdominal pain, heartburn, nausea and vomiting.  Genitourinary: Negative for dysuria and urgency.  Skin: Negative for  rash.  Neurological: Negative for tremors and weakness.  Endo/Heme/Allergies: Does not bruise/bleed easily.  All other systems reviewed and are negative.    VS: BP 128/78 (BP Location: Left Arm, Cuff Size: Normal)   Pulse 82   Ht 5\' 2"  (1.575 m)   Wt 217 lb (98.4 kg)   SpO2 99%   BMI 39.69 kg/m      PHYSICAL EXAM  Physical Exam  Constitutional: He is oriented to person, place, and time. He appears well-developed and well-nourished. No distress.  HENT:  Head: Normocephalic and atraumatic.  Mouth/Throat: No oropharyngeal exudate.  Neck: Neck supple.  Cardiovascular: Normal rate, regular rhythm and normal heart sounds.  No murmur heard. Pulmonary/Chest: Effort normal and breath sounds normal. No stridor. No respiratory distress. He has no wheezes.  Abdominal: Soft. Bowel sounds are normal.  Musculoskeletal: Normal range of motion. He exhibits no edema.  Neurological: He is alert and oriented to person, place, and time. No cranial nerve deficit.  Skin: Skin is warm. He is not diaphoretic.  Psychiatric: He has a normal mood and affect.       ASSESSMENT/PLAN   46 yo pleasant overweight white male with signs and symptoms of fatigue,excessive daytime sleepiness and loud snoring with choking with  OSA-under control at this time and treated affectively with CPAP  1.continue OSA therapy 2.diet and exercise and weight loss recommended  Follow up in 1 year  Patient satisfied with Plan of action and management. All questions answered  Corrin Parker, M.D.  [facility]  Medical Director [place] Director [facility] Cardio-Pulmonary Department

## 2017-11-16 NOTE — Patient Instructions (Signed)
Continue osa therapy as prescribed

## 2017-11-18 ENCOUNTER — Other Ambulatory Visit: Payer: Self-pay | Admitting: Family Medicine

## 2017-11-23 ENCOUNTER — Encounter: Payer: Self-pay | Admitting: Family Medicine

## 2017-12-19 ENCOUNTER — Other Ambulatory Visit: Payer: Self-pay | Admitting: Family Medicine

## 2018-01-21 ENCOUNTER — Other Ambulatory Visit: Payer: Self-pay | Admitting: Family Medicine

## 2018-01-21 NOTE — Telephone Encounter (Signed)
Last filled:  05/01/16, #30 Last OV:  01/19/17 Next OV: none

## 2018-01-24 NOTE — Telephone Encounter (Signed)
Sent electronically 

## 2018-04-07 DIAGNOSIS — G4733 Obstructive sleep apnea (adult) (pediatric): Secondary | ICD-10-CM | POA: Diagnosis not present

## 2018-04-12 ENCOUNTER — Ambulatory Visit (INDEPENDENT_AMBULATORY_CARE_PROVIDER_SITE_OTHER): Payer: 59 | Admitting: Internal Medicine

## 2018-04-12 ENCOUNTER — Telehealth: Payer: Self-pay

## 2018-04-12 ENCOUNTER — Ambulatory Visit (INDEPENDENT_AMBULATORY_CARE_PROVIDER_SITE_OTHER): Payer: 59

## 2018-04-12 ENCOUNTER — Encounter: Payer: Self-pay | Admitting: Internal Medicine

## 2018-04-12 VITALS — BP 128/76 | HR 82 | Temp 98.9°F | Ht 62.0 in | Wt 215.4 lb

## 2018-04-12 DIAGNOSIS — R059 Cough, unspecified: Secondary | ICD-10-CM

## 2018-04-12 DIAGNOSIS — R062 Wheezing: Secondary | ICD-10-CM

## 2018-04-12 DIAGNOSIS — R05 Cough: Secondary | ICD-10-CM

## 2018-04-12 DIAGNOSIS — J40 Bronchitis, not specified as acute or chronic: Secondary | ICD-10-CM | POA: Diagnosis not present

## 2018-04-12 MED ORDER — AZITHROMYCIN 250 MG PO TABS: ORAL_TABLET | ORAL | 0 refills | Status: DC

## 2018-04-12 MED ORDER — ALBUTEROL SULFATE HFA 108 (90 BASE) MCG/ACT IN AERS: 1.0000 | INHALATION_SPRAY | Freq: Four times a day (QID) | RESPIRATORY_TRACT | 2 refills | Status: DC | PRN

## 2018-04-12 MED ORDER — METHYLPREDNISOLONE ACETATE 40 MG/ML IJ SUSP: 40.0000 mg | Freq: Once | INTRAMUSCULAR | Status: DC

## 2018-04-12 NOTE — Patient Instructions (Addendum)
F/u with Dr. Darnell Level if not better in 1 week or less  Schedule regular follow up with him   Acute Bronchitis, Adult Acute bronchitis is sudden (acute) swelling of the air tubes (bronchi) in the lungs. Acute bronchitis causes these tubes to fill with mucus, which can make it hard to breathe. It can also cause coughing or wheezing. In adults, acute bronchitis usually goes away within 2 weeks. A cough caused by bronchitis may last up to 3 weeks. Smoking, allergies, and asthma can make the condition worse. Repeated episodes of bronchitis may cause further lung problems, such as chronic obstructive pulmonary disease (COPD). What are the causes? This condition can be caused by germs and by substances that irritate the lungs, including:  Cold and flu viruses. This condition is most often caused by the same virus that causes a cold.  Bacteria.  Exposure to tobacco smoke, dust, fumes, and air pollution.  What increases the risk? This condition is more likely to develop in people who:  Have close contact with someone with acute bronchitis.  Are exposed to lung irritants, such as tobacco smoke, dust, fumes, and vapors.  Have a weak immune system.  Have a respiratory condition such as asthma.  What are the signs or symptoms? Symptoms of this condition include:  A cough.  Coughing up clear, yellow, or green mucus.  Wheezing.  Chest congestion.  Shortness of breath.  A fever.  Body aches.  Chills.  A sore throat.  How is this diagnosed? This condition is usually diagnosed with a physical exam. During the exam, your health care provider may order tests, such as chest X-rays, to rule out other conditions. He or she may also:  Test a sample of your mucus for bacterial infection.  Check the level of oxygen in your blood. This is done to check for pneumonia.  Do a chest X-ray or lung function testing to rule out pneumonia and other conditions.  Perform blood tests.  Your health care  provider will also ask about your symptoms and medical history. How is this treated? Most cases of acute bronchitis clear up over time without treatment. Your health care provider may recommend:  Drinking more fluids. Drinking more makes your mucus thinner, which may make it easier to breathe.  Taking a medicine for a fever or cough.  Taking an antibiotic medicine.  Using an inhaler to help improve shortness of breath and to control a cough.  Using a cool mist vaporizer or humidifier to make it easier to breathe.  Follow these instructions at home: Medicines  Take over-the-counter and prescription medicines only as told by your health care provider.  If you were prescribed an antibiotic, take it as told by your health care provider. Do not stop taking the antibiotic even if you start to feel better. General instructions  Get plenty of rest.  Drink enough fluids to keep your urine clear or pale yellow.  Avoid smoking and secondhand smoke. Exposure to cigarette smoke or irritating chemicals will make bronchitis worse. If you smoke and you need help quitting, ask your health care provider. Quitting smoking will help your lungs heal faster.  Use an inhaler, cool mist vaporizer, or humidifier as told by your health care provider.  Keep all follow-up visits as told by your health care provider. This is important. How is this prevented? To lower your risk of getting this condition again:  Wash your hands often with soap and water. If soap and water are not available,  use hand sanitizer.  Avoid contact with people who have cold symptoms.  Try not to touch your hands to your mouth, nose, or eyes.  Make sure to get the flu shot every year.  Contact a health care provider if:  Your symptoms do not improve in 2 weeks of treatment. Get help right away if:  You cough up blood.  You have chest pain.  You have severe shortness of breath.  You become dehydrated.  You faint or  keep feeling like you are going to faint.  You keep vomiting.  You have a severe headache.  Your fever or chills gets worse. This information is not intended to replace advice given to you by your health care provider. Make sure you discuss any questions you have with your health care provider. Document Released: 01/07/2005 Document Revised: 06/24/2016 Document Reviewed: 05/20/2016 Elsevier Interactive Patient Education  [employer].

## 2018-04-12 NOTE — Progress Notes (Signed)
Chief Complaint  Patient presents with  . Cough   F/u cough x 2 days with wheezing no h/o asthma/allergies, daughter also with sore throat recently she is 47 y.o He is coughing with phelgm which he swallows, wheezing and crackles. No fever, no sob, he has h/a from coughing, eyes watery and hurting when coughing    Review of Systems  Constitutional: Negative for fever.  HENT: Negative for hearing loss.   Eyes: Negative for blurred vision.  Respiratory: Positive for cough, sputum production and wheezing. Negative for shortness of breath.   Cardiovascular: Negative for chest pain.  Skin: Negative for rash.  Endo/Heme/Allergies: Negative for environmental allergies.   Past Medical History:  Diagnosis Date  . Anxiety    plane rides  . H/O: Bell's palsy 2004  . H/O: pneumonia age 47-13   History reviewed. No pertinent surgical history. Family History  Problem Relation Age of Onset  . Alcohol abuse Father   . Cirrhosis Father        Alcohol induced  . Cancer Mother 15       esophageal (smoker)  . Alcohol abuse Brother   . Anxiety disorder Sister   . Stroke Maternal Grandfather   . Heart disease Maternal Grandfather   . Heart disease Maternal Grandmother        and MGGM  . Alcohol abuse Paternal Grandfather   . Diabetes Neg Hx    Social History   Socioeconomic History  . Marital status: Married    Spouse name: Not on file  . Number of children: 0  . Years of education: 52  . Highest education level: Not on file  Occupational History  . Occupation: Chief Executive Officer: [employer]  . Financial resource strain: Not on file  . Food insecurity:    Worry: Not on file    Inability: Not on file  . Transportation needs:    Medical: Not on file    Non-medical: Not on file  Tobacco Use  . Smoking status: Never Smoker  . Smokeless tobacco: Former Systems developer    Types: Snuff  Substance and Sexual Activity  . Alcohol use: No  . Drug use: No  . Sexual  activity: Not on file  Lifestyle  . Physical activity:    Days per week: Not on file    Minutes per session: Not on file  . Stress: Not on file  Relationships  . Social connections:    Talks on phone: Not on file    Gets together: Not on file    Attends religious service: Not on file    Active member of club or organization: Not on file    Attends meetings of clubs or organizations: Not on file    Relationship status: Not on file  . Intimate partner violence:    Fear of current or ex partner: Not on file    Emotionally abused: Not on file    Physically abused: Not on file    Forced sexual activity: Not on file  Other Topics Concern  . Not on file  Social History Narrative   Caffeine: 3-4 caffeinated beverages   Married.  Lives with wife, 1 dog.   No children   Theatre manager, works from home   Activity: started swimming, biking   Diet: water, tries to get fruits and vegetables   Edu: 12th grade   Current Meds  Medication Sig  . ALPRAZolam (XANAX) 0.25 MG tablet TAKE 1 TABLET BY  MOUTH EVERY NIGHT AT BEDTIME AS NEEDED.  . fluticasone (FLONASE) 50 MCG/ACT nasal spray USE 2 SPRAYS INTO EACH NOSTRIL ONCE DAILY AS DIRECTED.  Marland Kitchen sertraline (ZOLOFT) 50 MG tablet TAKE 1 TABLET BY MOUTH DAILY   Allergies  Allergen Reactions  . Citalopram Hydrobromide Other (See Comments)    tachycardia   No results found for this or any previous visit (from the past 2160 hour(s)). Objective  Body mass index is 39.4 kg/m. Wt Readings from Last 3 Encounters:  04/12/18 215 lb 6.4 oz (97.7 kg)  11/16/17 217 lb (98.4 kg)  01/19/17 216 lb 12 oz (98.3 kg)   Temp Readings from Last 3 Encounters:  04/12/18 98.9 F (37.2 C) (Oral)  01/19/17 98.9 F (37.2 C) (Oral)  07/23/16 98.4 F (36.9 C) (Oral)   BP Readings from Last 3 Encounters:  04/12/18 128/76  11/16/17 128/78  01/19/17 120/78   Pulse Readings from Last 3 Encounters:  04/12/18 82  11/16/17 82  01/19/17 84    Physical  Exam  Constitutional: He is oriented to person, place, and time. Vital signs are normal. He appears well-developed and well-nourished. He is cooperative.  HENT:  Head: Normocephalic and atraumatic.  Mouth/Throat: Oropharynx is clear and moist and mucous membranes are normal.  Eyes: Pupils are equal, round, and reactive to light. Conjunctivae are normal.  Cardiovascular: Normal rate, regular rhythm and normal heart sounds.  Pulmonary/Chest: Effort normal. He has wheezes.  Neurological: He is alert and oriented to person, place, and time. Gait normal.  Skin: Skin is warm, dry and intact.  Psychiatric: He has a normal mood and affect. His speech is normal and behavior is normal. Judgment and thought content normal. Cognition and memory are normal.  Nursing note and vitals reviewed.   Assessment   1. Bronchitis  Plan  1. Zpack, cxr today, prn Albuterol, depomedrol 40 x 1  F/u with PCP w/in 1 week if needed otherwise due to sch regular f/u  rec mucinex dm or robitussin DM   Provider: Dr. Olivia Mackie [facility]

## 2018-04-12 NOTE — Telephone Encounter (Signed)
Copied from [place] [phone]. Topic: Inquiry >> Apr 12, 2018 10:32 AM Vernona Rieger wrote: Reason for CRM: [facility] patient coming in for an acute today with Dr Aundra Dubin

## 2018-04-12 NOTE — Progress Notes (Signed)
Pre visit review using our clinic review tool, if applicable. No additional management support is needed unless otherwise documented below in the visit note. 

## 2018-04-12 NOTE — Addendum Note (Signed)
Addended by: Elpidio Galea T on: 04/12/2018 04:13 PM   Modules accepted: Orders

## 2018-04-13 ENCOUNTER — Encounter: Payer: Self-pay | Admitting: Internal Medicine

## 2018-04-13 ENCOUNTER — Telehealth: Payer: Self-pay | Admitting: Family Medicine

## 2018-04-13 NOTE — Telephone Encounter (Signed)
Patient returning call Copied from [place] [phone]. Topic: Quick Communication - Lab Results >> Apr 13, 2018  3:17 PM Babs Bertin, CMA wrote: Called patient to inform them of [id number] lab results. When patient returns call, triage nurse may disclose results.

## 2018-04-14 ENCOUNTER — Encounter: Payer: Self-pay | Admitting: Family Medicine

## 2018-04-14 ENCOUNTER — Other Ambulatory Visit: Payer: Self-pay | Admitting: Family Medicine

## 2018-04-14 MED ORDER — SERTRALINE HCL 50 MG PO TABS: 50.0000 mg | ORAL_TABLET | Freq: Every day | ORAL | 1 refills | Status: DC

## 2018-05-13 ENCOUNTER — Telehealth: Payer: Self-pay

## 2018-05-13 ENCOUNTER — Encounter: Payer: Self-pay | Admitting: Family Medicine

## 2018-05-13 MED ORDER — FLUTICASONE PROPIONATE 50 MCG/ACT NA SUSP: NASAL | 0 refills | Status: DC

## 2018-05-13 NOTE — Telephone Encounter (Signed)
Received MyChart message from pt requesting refills for sertraline and Flonase.  Chart shows pt should have a refill on file at pharmacy.  Spoke with [facility] asking about sertraline refills available.  Says pt has one so I requested they fill it per pt request.  Sent refill for Flonase via escript.  Notified pt via [place].

## 2018-05-24 ENCOUNTER — Encounter: Payer: Self-pay | Admitting: Family Medicine

## 2018-05-24 ENCOUNTER — Ambulatory Visit (INDEPENDENT_AMBULATORY_CARE_PROVIDER_SITE_OTHER): Payer: 59 | Admitting: Family Medicine

## 2018-05-24 ENCOUNTER — Telehealth: Payer: Self-pay

## 2018-05-24 VITALS — BP 130/82 | HR 71 | Temp 98.2°F | Ht 65.0 in | Wt 212.8 lb

## 2018-05-24 DIAGNOSIS — Z9989 Dependence on other enabling machines and devices: Secondary | ICD-10-CM

## 2018-05-24 DIAGNOSIS — F418 Other specified anxiety disorders: Secondary | ICD-10-CM

## 2018-05-24 DIAGNOSIS — R5383 Other fatigue: Secondary | ICD-10-CM

## 2018-05-24 DIAGNOSIS — G4733 Obstructive sleep apnea (adult) (pediatric): Secondary | ICD-10-CM | POA: Diagnosis not present

## 2018-05-24 DIAGNOSIS — Z0000 Encounter for general adult medical examination without abnormal findings: Secondary | ICD-10-CM

## 2018-05-24 DIAGNOSIS — E669 Obesity, unspecified: Secondary | ICD-10-CM

## 2018-05-24 DIAGNOSIS — F40243 Fear of flying: Secondary | ICD-10-CM | POA: Diagnosis not present

## 2018-05-24 MED ORDER — BUPROPION HCL ER (SR) 100 MG PO TB12: 100.0000 mg | ORAL_TABLET | Freq: Two times a day (BID) | ORAL | 1 refills | Status: DC

## 2018-05-24 NOTE — Telephone Encounter (Signed)
Pt provided Physician Results form to be completed and faxed.  Form is in basket on [employer] pending lab results.

## 2018-05-24 NOTE — Assessment & Plan Note (Signed)
Ongoing trouble, work related stress may be increasing. Long term on sertraline. Noticing some decreased libido ?related - prior testosterone eval stable. Discussed wellbutrin option - will cross taper from SSRI to wellbutrin. Update 1 mo over mychart with effect. Pt agrees with plan.  PHQ9 = 3 GAD7 = 6

## 2018-05-24 NOTE — Assessment & Plan Note (Signed)
Preventative protocols reviewed and updated unless pt declined. Discussed healthy diet and lifestyle.  

## 2018-05-24 NOTE — Patient Instructions (Addendum)
Let's cross taper off zoloft onto wellbutrin. Decrease zoloft to 1/2 pill for 1 week and start wellbutrin 100mg  daily for 1 week then stop zoloft and increase wellbutrin to 100mg  twice daily.  You are doing well today. Work on healthy stress relieving strategies.  Work in [employer] and incorporating regular exercise into routine.  Update me on mychart in 1 month with how you do with new medicine, sooner if needed.   Health Maintenance, Male A healthy lifestyle and preventive care is important for your health and wellness. Ask your health care provider about what schedule of regular examinations is right for you. What should I know about weight and diet? Eat a Healthy Diet  Eat plenty of vegetables, fruits, whole grains, low-fat dairy products, and lean protein.  Do not eat a lot of foods high in solid fats, added sugars, or salt.  Maintain a Healthy Weight Regular exercise can help you achieve or maintain a healthy weight. You should:  Do at least 150 minutes of exercise each week. The exercise should increase your heart rate and make you sweat (moderate-intensity exercise).  Do strength-training exercises at least twice a week.  Watch Your Levels of Cholesterol and Blood Lipids  Have your blood tested for lipids and cholesterol every 5 years starting at 47 years of age. If you are at high risk for heart disease, you should start having your blood tested when you are 47 years old. You may need to have your cholesterol levels checked more often if: ? Your lipid or cholesterol levels are high. ? You are older than 47 years of age. ? You are at high risk for heart disease.  What should I know about cancer screening? Many types of cancers can be detected early and may often be prevented. Lung Cancer  You should be screened every year for lung cancer if: ? You are a current smoker who has smoked for at least 30 years. ? You are a former smoker who has quit within the past 15  years.  Talk to your health care provider about your screening options, when you should start screening, and how often you should be screened.  Colorectal Cancer  Routine colorectal cancer screening usually begins at 47 years of age and should be repeated every 5-10 years until you are 47 years old. You may need to be screened more often if early forms of precancerous polyps or small growths are found. Your health care provider may recommend screening at an earlier age if you have risk factors for colon cancer.  Your health care provider may recommend using home test kits to check for hidden blood in the stool.  A small camera at the end of a tube can be used to examine your colon (sigmoidoscopy or colonoscopy). This checks for the earliest forms of colorectal cancer.  Prostate and Testicular Cancer  Depending on your age and overall health, your health care provider may do certain tests to screen for prostate and testicular cancer.  Talk to your health care provider about any symptoms or concerns you have about testicular or prostate cancer.  Skin Cancer  Check your skin from head to toe regularly.  Tell your health care provider about any new moles or changes in moles, especially if: ? There is a change in a mole's size, shape, or color. ? You have a mole that is larger than a pencil eraser.  Always use sunscreen. Apply sunscreen liberally and repeat throughout the day.  Protect yourself  by wearing long sleeves, pants, a wide-brimmed hat, and sunglasses when outside.  What should I know about heart disease, diabetes, and high blood pressure?  If you are 24-43 years of age, have your blood pressure checked every 3-5 years. If you are 51 years of age or older, have your blood pressure checked every year. You should have your blood pressure measured twice-once when you are at a hospital or clinic, and once when you are not at a hospital or clinic. Record the average of the two  measurements. To check your blood pressure when you are not at a hospital or clinic, you can use: ? An automated blood pressure machine at a pharmacy. ? A home blood pressure monitor.  Talk to your health care provider about your target blood pressure.  If you are between 56-20 years old, ask your health care provider if you should take aspirin to prevent heart disease.  Have regular diabetes screenings by checking your fasting blood sugar level. ? If you are at a normal weight and have a low risk for diabetes, have this test once every three years after the age of 19. ? If you are overweight and have a high risk for diabetes, consider being tested at a younger age or more often.  A one-time screening for abdominal aortic aneurysm (AAA) by ultrasound is recommended for men aged 75-75 years who are current or former smokers. What should I know about preventing infection? Hepatitis B If you have a higher risk for hepatitis B, you should be screened for this virus. Talk with your health care provider to find out if you are at risk for hepatitis B infection. Hepatitis C Blood testing is recommended for:  Everyone born from [birth year] through 1965.  Anyone with known risk factors for hepatitis C.  Sexually Transmitted Diseases (STDs)  You should be screened each year for STDs including gonorrhea and chlamydia if: ? You are sexually active and are younger than 47 years of age. ? You are older than 47 years of age and your health care provider tells you that you are at risk for this type of infection. ? Your sexual activity has changed since you were last screened and you are at an increased risk for chlamydia or gonorrhea. Ask your health care provider if you are at risk.  Talk with your health care provider about whether you are at high risk of being infected with HIV. Your health care provider may recommend a prescription medicine to help prevent HIV infection.  What else can I do?  Schedule  regular health, dental, and eye exams.  Stay current with your vaccines (immunizations).  Do not use any tobacco products, such as cigarettes, chewing tobacco, and e-cigarettes. If you need help quitting, ask your health care provider.  Limit alcohol intake to no more than 2 drinks per day. One drink equals 12 ounces of beer, 5 ounces of wine, or 1 ounces of hard liquor.  Do not use street drugs.  Do not share needles.  Ask your health care provider for help if you need support or information about quitting drugs.  Tell your health care provider if you often feel depressed.  Tell your health care provider if you have ever been abused or do not feel safe at home. This information is not intended to replace advice given to you by your health care provider. Make sure you discuss any questions you have with your health care provider. Document Released: 05/28/2008 Document Revised: 07/29/2016  Document Reviewed: 09/03/2015 Elsevier Interactive Patient Education  [employer].

## 2018-05-24 NOTE — Assessment & Plan Note (Signed)
OSA on CPAP managed by pulm.

## 2018-05-24 NOTE — Progress Notes (Signed)
BP 130/82 (BP Location: Left Arm, Patient Position: Sitting, Cuff Size: Large)   Pulse 71   Temp 98.2 F (36.8 C) (Oral)   Ht 5\' 5"  (1.651 m)   Wt 212 lb 12 oz (96.5 kg)   SpO2 97%   BMI 35.40 kg/m    CC: CPE Subjective:    Patient ID: Nicholas Saunders, male    DOB: Aug 27, 1971, 47 y.o.   MRN: [id number]  HPI: Nicholas Saunders is a 47 y.o. male presenting on 05/24/2018 for Annual Exam (Has Biometric form to complete and fax. Form is in basket on Lisa's desk.)   Noticing increasing anxiety - work related and somewhat self-imposed as he's doing well at work. Long term on zoloft 50mg  daily - prior tried celexa (tachycardia). zoloft may be affecting libido. Would be interested in wellbutrin trial.  Rare xanax for flights.   Preventative: Flu shot - did not receive Td 2006, Tdap 03/2014.  Hep A/B 03/2014, 04/2014 Seat belt use discussed Sunscreen use discussed. No changing moles on skin.  Non smoker Alcohol - rare Dentist Q6 months Eye doctor - yearly  Caffeine: 3-4 caffeinated beverages  Married. Lives with wife, 1 dog  No children  Edu: 12th grade  Theatre manager, works from home  Activity: walking dogs  Diet: some water, fruits/vegetables some   Relevant past medical, surgical, family and social history reviewed and updated as indicated. Interim medical history since our last visit reviewed. Allergies and medications reviewed and updated. Outpatient Medications Prior to Visit  Medication Sig Dispense Refill  . ALPRAZolam (XANAX) 0.25 MG tablet TAKE 1 TABLET BY MOUTH EVERY NIGHT AT BEDTIME AS NEEDED. 30 tablet 0  . fluticasone (FLONASE) 50 MCG/ACT nasal spray USE 2 SPRAYS INTO EACH NOSTRIL ONCE DAILY AS DIRECTED. 16 g 0  . sertraline (ZOLOFT) 50 MG tablet Take 1 tablet (50 mg total) by mouth daily. 30 tablet 1  . albuterol (PROVENTIL HFA;VENTOLIN HFA) 108 (90 Base) MCG/ACT inhaler Inhale 1-2 puffs into the lungs every 6 (six) hours as needed for wheezing or shortness of  breath. 1 Inhaler 2  . azithromycin (ZITHROMAX) 250 MG tablet 2 pills today, 1 pill day 2-5 6 tablet 0   Facility-Administered Medications Prior to Visit  Medication Dose Route Frequency Provider Last Rate Last Dose  . methylPREDNISolone acetate (DEPO-MEDROL) injection 40 mg  40 mg Intramuscular Once McLean-Scocuzza, Nino Glow, MD         Per HPI unless specifically indicated in ROS section below Review of Systems  Constitutional: Negative for activity change, appetite change, chills, fatigue, fever and unexpected weight change.  HENT: Negative for hearing loss.   Eyes: Negative for visual disturbance.  Respiratory: Negative for cough, chest tightness, shortness of breath and wheezing.   Cardiovascular: Negative for chest pain, palpitations and leg swelling.  Gastrointestinal: Negative for abdominal distention, abdominal pain, blood in stool, constipation, diarrhea, nausea and vomiting.  Genitourinary: Negative for difficulty urinating and hematuria.  Musculoskeletal: Negative for arthralgias, myalgias and neck pain.  Skin: Negative for rash.  Neurological: Negative for dizziness, seizures, syncope and headaches.  Hematological: Negative for adenopathy. Does not bruise/bleed easily.  Psychiatric/Behavioral: Negative for dysphoric mood. The patient is nervous/anxious.        Objective:    BP 130/82 (BP Location: Left Arm, Patient Position: Sitting, Cuff Size: Large)   Pulse 71   Temp 98.2 F (36.8 C) (Oral)   Ht 5\' 5"  (1.651 m)   Wt 212 lb 12 oz (96.5 kg)  SpO2 97%   BMI 35.40 kg/m   Wt Readings from Last 3 Encounters:  05/24/18 212 lb 12 oz (96.5 kg)  04/12/18 215 lb 6.4 oz (97.7 kg)  11/16/17 217 lb (98.4 kg)    Physical Exam  Constitutional: He is oriented to person, place, and time. He appears well-developed and well-nourished. No distress.  HENT:  Head: Normocephalic and atraumatic.  Right Ear: Hearing, tympanic membrane, external ear and ear canal normal.  Left Ear:  Hearing, tympanic membrane, external ear and ear canal normal.  Nose: Nose normal.  Mouth/Throat: Uvula is midline, oropharynx is clear and moist and mucous membranes are normal. No oropharyngeal exudate, posterior oropharyngeal edema or posterior oropharyngeal erythema.  Eyes: Pupils are equal, round, and reactive to light. Conjunctivae and EOM are normal. No scleral icterus.  Neck: Normal range of motion. Neck supple. No thyromegaly present.  Cardiovascular: Normal rate, regular rhythm, normal heart sounds and intact distal pulses.  No murmur heard. Pulses:      Radial pulses are 2+ on the right side, and 2+ on the left side.  Pulmonary/Chest: Effort normal and breath sounds normal. No respiratory distress. He has no wheezes. He has no rales.  Abdominal: Soft. Bowel sounds are normal. He exhibits no distension and no mass. There is no tenderness. There is no rebound and no guarding.  Musculoskeletal: Normal range of motion. He exhibits no edema.  Lymphadenopathy:    He has no cervical adenopathy.  Neurological: He is alert and oriented to person, place, and time.  CN grossly intact, station and gait intact  Skin: Skin is warm and dry. No rash noted.  Psychiatric: He has a normal mood and affect. His behavior is normal. Judgment and thought content normal.  Nursing note and vitals reviewed.  Results for orders placed or performed in visit on 46/56/81  Basic metabolic panel  Result Value Ref Range   Sodium 137 135 - 145 mEq/L   Potassium 4.2 3.5 - 5.1 mEq/L   Chloride 104 96 - 112 mEq/L   CO2 29 19 - 32 mEq/L   Glucose, Bld 117 (H) 70 - 99 mg/dL   BUN 19 6 - 23 mg/dL   Creatinine, Ser 1.12 0.40 - 1.50 mg/dL   Calcium 9.3 8.4 - 10.5 mg/dL   GFR 75.46 >60.00 mL/min  CBC with Differential/Platelet  Result Value Ref Range   WBC 6.9 4.0 - 10.5 K/uL   RBC 5.37 4.22 - 5.81 Mil/uL   Hemoglobin 15.3 13.0 - 17.0 g/dL   HCT 45.6 39.0 - 52.0 %   MCV 84.8 78.0 - 100.0 fl   MCHC 33.5 30.0 -  36.0 g/dL   RDW 13.8 11.5 - 15.5 %   Platelets 238.0 150.0 - 400.0 K/uL   Neutrophils Relative % 55.5 43.0 - 77.0 %   Lymphocytes Relative 32.7 12.0 - 46.0 %   Monocytes Relative 9.8 3.0 - 12.0 %   Eosinophils Relative 1.4 0.0 - 5.0 %   Basophils Relative 0.6 0.0 - 3.0 %   Neutro Abs 3.8 1.4 - 7.7 K/uL   Lymphs Abs 2.3 0.7 - 4.0 K/uL   Monocytes Absolute 0.7 0.1 - 1.0 K/uL   Eosinophils Absolute 0.1 0.0 - 0.7 K/uL   Basophils Absolute 0.0 0.0 - 0.1 K/uL  Hepatic function panel  Result Value Ref Range   Total Bilirubin 0.5 0.2 - 1.2 mg/dL   Bilirubin, Direct 0.1 0.0 - 0.3 mg/dL   Alkaline Phosphatase 59 39 - 117 U/L  AST 22 0 - 37 U/L   ALT 25 0 - 53 U/L   Total Protein 7.0 6.0 - 8.3 g/dL   Albumin 4.2 3.5 - 5.2 g/dL  Lipase  Result Value Ref Range   Lipase 18.0 11.0 - 59.0 U/L      Assessment & Plan:   Problem List Items Addressed This Visit    Phobia, flying    Rare xanax PRN air travel.       Relevant Medications   buPROPion (WELLBUTRIN SR) 100 MG 12 hr tablet   OSA on CPAP    OSA on CPAP managed by pulm.      Obesity, Class II, BMI 35-39.9, no comorbidity    Encouraged healthy diet and lifestyle changes to affect sustainable weight loss.       Relevant Orders   Lipid panel   Comprehensive metabolic panel   Healthcare maintenance - Primary    Preventative protocols reviewed and updated unless pt declined. Discussed healthy diet and lifestyle.       Fatigue   Relevant Orders   Vitamin B12   Anxiety with depression    Ongoing trouble, work related stress may be increasing. Long term on sertraline. Noticing some decreased libido ?related - prior testosterone eval stable. Discussed wellbutrin option - will cross taper from SSRI to wellbutrin. Update 1 mo over mychart with effect. Pt agrees with plan.  PHQ9 = 3 GAD7 = 6      Relevant Medications   buPROPion (WELLBUTRIN SR) 100 MG 12 hr tablet       Meds ordered this encounter  Medications  . buPROPion  (WELLBUTRIN SR) 100 MG 12 hr tablet    Sig: Take 1 tablet (100 mg total) by mouth 2 (two) times daily.    Dispense:  60 tablet    Refill:  1   Orders Placed This Encounter  Procedures  . Lipid panel  . Comprehensive metabolic panel  . Vitamin B12    Follow up plan: Return in about 1 year (around 05/25/2019) for annual exam, prior fasting for blood work.  Ria Bush, MD

## 2018-05-24 NOTE — Assessment & Plan Note (Signed)
Encouraged healthy diet and lifestyle changes to affect sustainable weight loss.  

## 2018-05-24 NOTE — Assessment & Plan Note (Signed)
Rare xanax PRN air travel.

## 2018-05-25 LAB — COMPREHENSIVE METABOLIC PANEL
ALBUMIN: 4.4 g/dL (ref 3.5–5.2)
ALK PHOS: 67 U/L (ref 39–117)
ALT: 21 U/L (ref 0–53)
AST: 19 U/L (ref 0–37)
BUN: 15 mg/dL (ref 6–23)
CALCIUM: 9.2 mg/dL (ref 8.4–10.5)
CO2: 26 mEq/L (ref 19–32)
CREATININE: 1.06 mg/dL (ref 0.40–1.50)
Chloride: 104 mEq/L (ref 96–112)
GFR: 79.71 mL/min (ref >60.00)
Glucose, Bld: 86 mg/dL (ref 70–99)
Potassium: 4.1 mEq/L (ref 3.5–5.1)
Sodium: 139 mEq/L (ref 135–145)
TOTAL PROTEIN: 7.1 g/dL (ref 6.0–8.3)
Total Bilirubin: 0.6 mg/dL (ref 0.2–1.2)

## 2018-05-25 LAB — LIPID PANEL
CHOLESTEROL: 199 mg/dL (ref 0–200)
HDL: 35.3 mg/dL — ABNORMAL LOW (ref >39.00)
LDL CALC: 141 mg/dL — AB (ref 0–99)
NonHDL: 163.71
TRIGLYCERIDES: 115 mg/dL (ref 0.0–149.0)
Total CHOL/HDL Ratio: 6
VLDL: 23 mg/dL (ref 0.0–40.0)

## 2018-05-25 LAB — VITAMIN B12: Vitamin B-12: 366 pg/mL (ref 211–911)

## 2018-05-25 NOTE — Telephone Encounter (Signed)
Completed form including lab results. Faxed form, made copy to be scanned and mailed original to pt.

## 2018-05-27 ENCOUNTER — Encounter: Payer: Self-pay | Admitting: Internal Medicine

## 2018-05-27 ENCOUNTER — Encounter: Payer: Self-pay | Admitting: Family Medicine

## 2018-05-27 DIAGNOSIS — E785 Hyperlipidemia, unspecified: Secondary | ICD-10-CM | POA: Insufficient documentation

## 2018-05-30 ENCOUNTER — Encounter: Payer: Self-pay | Admitting: Family Medicine

## 2018-06-11 ENCOUNTER — Other Ambulatory Visit: Payer: Self-pay | Admitting: Family Medicine

## 2018-07-11 ENCOUNTER — Encounter: Payer: Self-pay | Admitting: Family Medicine

## 2018-07-13 ENCOUNTER — Encounter: Payer: Self-pay | Admitting: Family Medicine

## 2018-07-13 NOTE — Telephone Encounter (Signed)
Responded through prior [employer].

## 2018-07-15 ENCOUNTER — Encounter: Payer: Self-pay | Admitting: Family Medicine

## 2018-07-15 ENCOUNTER — Ambulatory Visit (INDEPENDENT_AMBULATORY_CARE_PROVIDER_SITE_OTHER): Payer: 59 | Admitting: Family Medicine

## 2018-07-15 VITALS — BP 118/70 | HR 73 | Temp 97.8°F | Ht 65.0 in | Wt 209.8 lb

## 2018-07-15 DIAGNOSIS — L989 Disorder of the skin and subcutaneous tissue, unspecified: Secondary | ICD-10-CM | POA: Diagnosis not present

## 2018-07-15 DIAGNOSIS — F418 Other specified anxiety disorders: Secondary | ICD-10-CM

## 2018-07-15 MED ORDER — SERTRALINE HCL 25 MG PO TABS: 25.0000 mg | ORAL_TABLET | Freq: Every day | ORAL | 3 refills | Status: DC

## 2018-07-15 NOTE — Patient Instructions (Addendum)
Wean off wellbutrin (once daily for a week then stop) Monitor stress levels at work - if ongoing, then may retry zoloft at a lower dose 25mg  daily - prescription printed out today.  We will refer you to dermatologist. 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

## 2018-07-15 NOTE — Assessment & Plan Note (Signed)
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

## 2018-07-15 NOTE — Assessment & Plan Note (Addendum)
Mild symptoms. Medications have caused more side effects than benefit. Will taper off wellbutrin and monitor stressors. If needed, will trial low dose sertraline (50mg  dose was previously most effective, but caused trouble with libido). Pt agrees with plan.  If trouble tolerating lower dose and ongoing stress, consider counseling referral instead of antidepressant.

## 2018-07-15 NOTE — Progress Notes (Signed)
BP 118/70 (BP Location: Left Arm, Patient Position: Sitting, Cuff Size: Large)   Pulse 73   Temp 97.8 F (36.6 C) (Oral)   Ht 5\' 5"  (1.651 m)   Wt 209 lb 12 oz (95.1 kg)   SpO2 97%   BMI 34.90 kg/m    CC: check skin lesion, discuss meds Subjective:    Patient ID: Nicholas Saunders, male    DOB: 13-Jun-1971, 47 y.o.   MRN: [id number]  HPI: Nicholas Saunders is a 47 y.o. male presenting on 07/15/2018 for Medication Follow Up (Here for Wellbutrin f/u.) and Skin Lesion (Wants to have skin lesion checked. Located on right posterior shoulder. Area is painful to touch. Noticed 3-4 wks ago. )   See recent pt email.  On antidepressant for work related stressors, overall mild. Latest trial wellbutrin caused worsening restlessness/anxiety. Undergoing slow taper off this. celexa caused tachycardia. zoloft affected libido.   R shoulder lesion present for 3-4 wks - tender to touch.   Relevant past medical, surgical, family and social history reviewed and updated as indicated. Interim medical history since our last visit reviewed. Allergies and medications reviewed and updated. Outpatient Medications Prior to Visit  Medication Sig Dispense Refill  . ALPRAZolam (XANAX) 0.25 MG tablet TAKE 1 TABLET BY MOUTH EVERY NIGHT AT BEDTIME AS NEEDED. 30 tablet 0  . fluticasone (FLONASE) 50 MCG/ACT nasal spray USE 2 SPRAYS INTO EACH NOSTRIL ONCE DAILY AS DIRECTED. 16 g 3  . buPROPion (WELLBUTRIN SR) 100 MG 12 hr tablet Take 1 tablet (100 mg total) by mouth 2 (two) times daily. 60 tablet 1  . sertraline (ZOLOFT) 50 MG tablet Take 1 tablet (50 mg total) by mouth daily. (Patient not taking: Reported on 07/15/2018) 30 tablet 1   Facility-Administered Medications Prior to Visit  Medication Dose Route Frequency Provider Last Rate Last Dose  . methylPREDNISolone acetate (DEPO-MEDROL) injection 40 mg  40 mg Intramuscular Once McLean-Scocuzza, Nino Glow, MD         Per HPI unless specifically indicated in ROS section  below Review of Systems     Objective:    BP 118/70 (BP Location: Left Arm, Patient Position: Sitting, Cuff Size: Large)   Pulse 73   Temp 97.8 F (36.6 C) (Oral)   Ht 5\' 5"  (1.651 m)   Wt 209 lb 12 oz (95.1 kg)   SpO2 97%   BMI 34.90 kg/m   Wt Readings from Last 3 Encounters:  07/15/18 209 lb 12 oz (95.1 kg)  05/24/18 212 lb 12 oz (96.5 kg)  04/12/18 215 lb 6.4 oz (97.7 kg)    Physical Exam  Constitutional: He appears well-developed and well-nourished. No distress.  Skin: Skin is warm and dry. No rash noted.  Raised nodular dome shaped lesion R posterior shoulder with rolling borders and central scabbing  Psychiatric: He has a normal mood and affect.  Nursing note and vitals reviewed.  Depression screen [facility] 2/9 07/15/2018 05/24/2018 05/24/2018 04/12/2018  Decreased Interest 0 0 0 0  Down, Depressed, Hopeless 0 0 0 0  PHQ - 2 Score 0 0 0 0  Altered sleeping 0 0 - -  Tired, decreased energy 1 1 - -  Change in appetite 0 0 - -  Feeling bad or failure about yourself  1 1 - -  Trouble concentrating 1 1 - -  Moving slowly or fidgety/restless 0 0 - -  Suicidal thoughts 0 0 - -  PHQ-9 Score 3 3 - -    GAD 7 :  Generalized Anxiety Score 07/15/2018 05/24/2018  Nervous, Anxious, on Edge 2 1  Control/stop worrying 2 1  Worry too much - different things 2 2  Trouble relaxing 1 0  Restless 1 1  Easily annoyed or irritable 0 0  Afraid - awful might happen 2 1  Total GAD 7 Score 10 6      Assessment & Plan:   Problem List Items Addressed This Visit    Skin lesion    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       Relevant Orders   Ambulatory referral to Dermatology   Anxiety with depression - Primary    Mild symptoms. Medications have caused more side effects than benefit. Will taper off wellbutrin and monitor stressors. If needed, will trial low dose sertraline (50mg  dose was previously most effective, but caused trouble  with libido). Pt agrees with plan.  If trouble tolerating lower dose and ongoing stress, consider counseling referral instead of antidepressant.       Relevant Medications   sertraline (ZOLOFT) 25 MG tablet       Meds ordered this encounter  Medications  . sertraline (ZOLOFT) 25 MG tablet    Sig: Take 1 tablet (25 mg total) by mouth daily.    Dispense:  30 tablet    Refill:  3   Orders Placed This Encounter  Procedures  . Ambulatory referral to Dermatology    Referral Priority:   Routine    Referral Type:   Consultation    Referral Reason:   Specialty Services Required    Requested Specialty:   Dermatology    Number of Visits Requested:   1    Follow up plan: Return if symptoms worsen or fail to improve.  Ria Bush, MD

## 2018-07-23 ENCOUNTER — Other Ambulatory Visit: Payer: Self-pay | Admitting: Family Medicine

## 2018-07-25 NOTE — Telephone Encounter (Signed)
Electronic refill request Last office visit 07/15/18 and see notes Confirm if refill is appropriate

## 2018-07-27 NOTE — Telephone Encounter (Signed)
Declined. Last visit we changed to sertraline and cross tapered off wellbutrin.

## 2018-08-14 DIAGNOSIS — C44621 Squamous cell carcinoma of skin of unspecified upper limb, including shoulder: Secondary | ICD-10-CM

## 2018-08-14 HISTORY — DX: Squamous cell carcinoma of skin of unspecified upper limb, including shoulder: C44.621

## 2018-08-16 ENCOUNTER — Encounter: Payer: Self-pay | Admitting: Family Medicine

## 2018-08-18 DIAGNOSIS — Z8589 Personal history of malignant neoplasm of other organs and systems: Secondary | ICD-10-CM

## 2018-08-18 DIAGNOSIS — C44622 Squamous cell carcinoma of skin of right upper limb, including shoulder: Secondary | ICD-10-CM | POA: Diagnosis not present

## 2018-08-18 HISTORY — DX: Personal history of malignant neoplasm of other organs and systems: Z85.89

## 2018-08-29 ENCOUNTER — Encounter: Payer: Self-pay | Admitting: Family Medicine

## 2018-08-30 ENCOUNTER — Encounter: Payer: Self-pay | Admitting: Family Medicine

## 2018-08-30 MED ORDER — SERTRALINE HCL 50 MG PO TABS: 50.0000 mg | ORAL_TABLET | Freq: Every day | ORAL | 9 refills | Status: DC

## 2018-10-04 ENCOUNTER — Other Ambulatory Visit: Payer: Self-pay | Admitting: Family Medicine

## 2019-02-04 ENCOUNTER — Other Ambulatory Visit: Payer: Self-pay | Admitting: Family Medicine

## 2019-03-29 ENCOUNTER — Other Ambulatory Visit: Payer: Self-pay | Admitting: Family Medicine

## 2019-04-10 IMAGING — DX DG CHEST 2V
2 series · 2 of 2 positions shown · non-contrast
Comparison: No prior chest imaging.

CLINICAL DATA: 46-year-old male with cough and wheezing for 2-3
days.

EXAM:
CHEST - 2 VIEW

[chest pa]
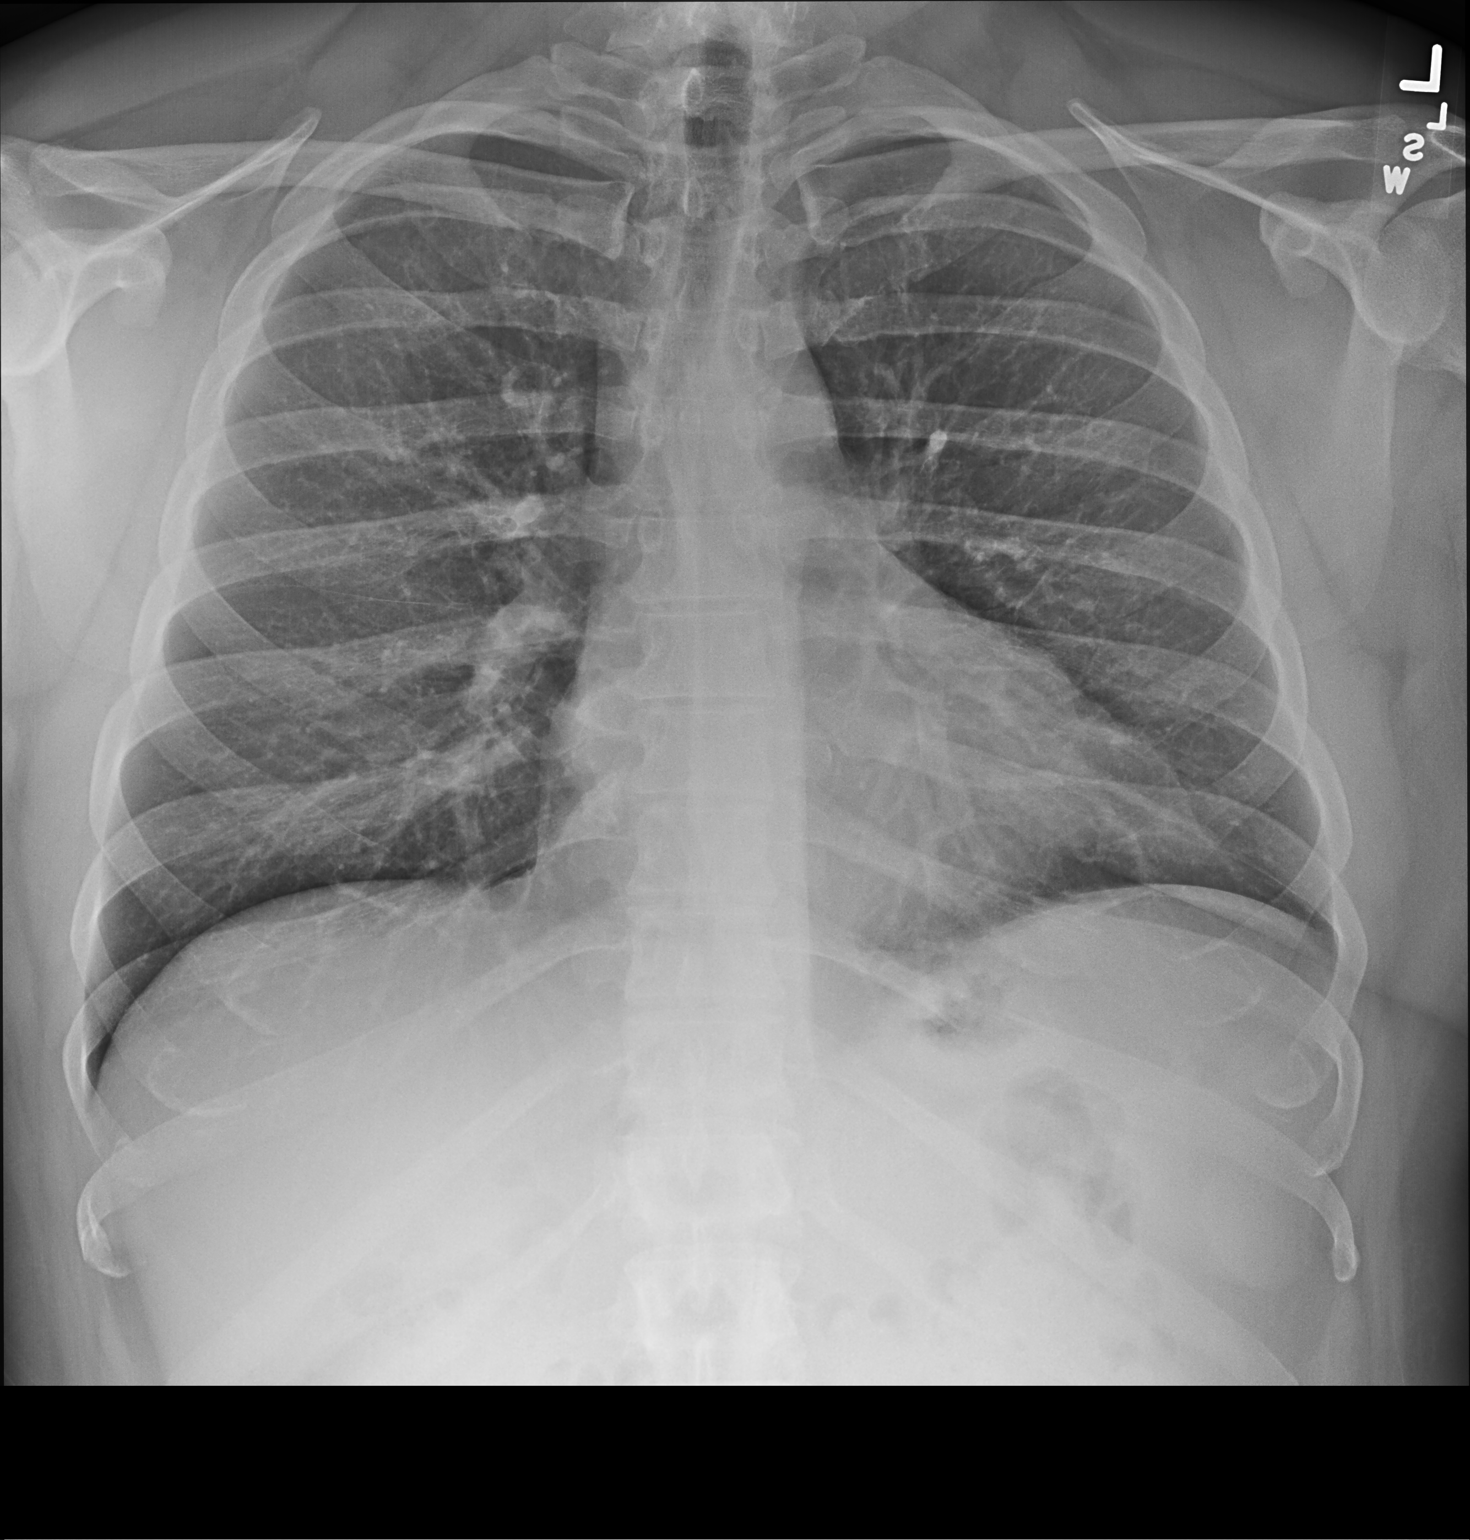

[chest lat]
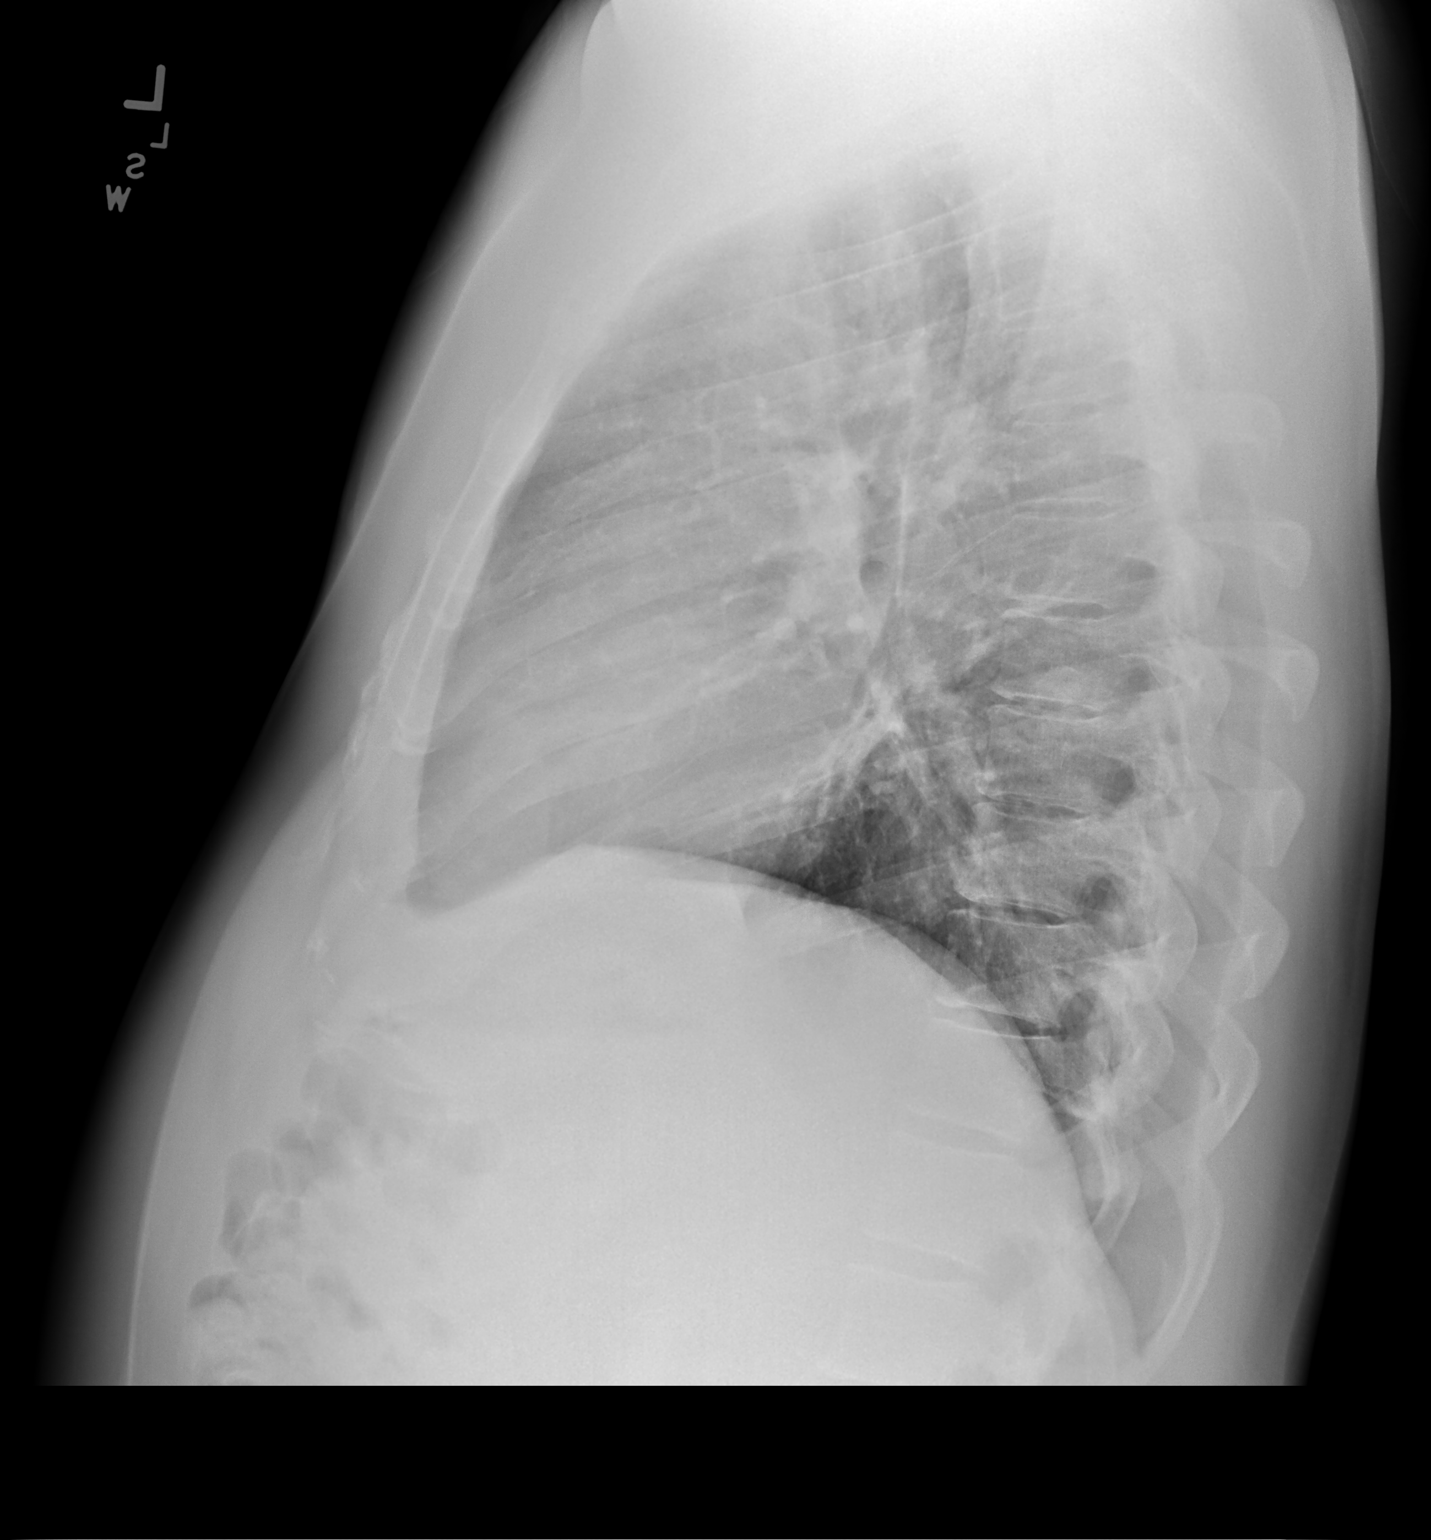

[2 of 2 positions shown; findings below may reference images not displayed]

FINDINGS: Normal lung volumes. Normal cardiac size and mediastinal contours.
Visualized tracheal air column is within normal limits. No
pneumothorax, pleural effusion, pulmonary edema or confluent
pulmonary opacity. Questionable mild diffuse increased pulmonary
interstitium.

No osseous abnormality identified. Negative visible bowel gas
pattern.
IMPRESSION: Negative aside from possible mildly increased pulmonary interstitial
markings diffusely. Consider viral/atypical respiratory infection.

## 2019-05-23 ENCOUNTER — Other Ambulatory Visit: Payer: Self-pay | Admitting: Family Medicine

## 2019-05-31 ENCOUNTER — Encounter: Payer: 59 | Admitting: Family Medicine

## 2019-06-20 ENCOUNTER — Encounter: Payer: Self-pay | Admitting: Family Medicine

## 2019-06-20 ENCOUNTER — Other Ambulatory Visit: Payer: Self-pay

## 2019-06-20 ENCOUNTER — Ambulatory Visit (INDEPENDENT_AMBULATORY_CARE_PROVIDER_SITE_OTHER): Payer: 59 | Admitting: Family Medicine

## 2019-06-20 VITALS — BP 120/80 | HR 77 | Temp 97.8°F | Ht 65.0 in | Wt 214.0 lb

## 2019-06-20 DIAGNOSIS — Z0000 Encounter for general adult medical examination without abnormal findings: Secondary | ICD-10-CM | POA: Diagnosis not present

## 2019-06-20 DIAGNOSIS — G4733 Obstructive sleep apnea (adult) (pediatric): Secondary | ICD-10-CM

## 2019-06-20 DIAGNOSIS — J309 Allergic rhinitis, unspecified: Secondary | ICD-10-CM | POA: Diagnosis not present

## 2019-06-20 DIAGNOSIS — E7800 Pure hypercholesterolemia, unspecified: Secondary | ICD-10-CM | POA: Diagnosis not present

## 2019-06-20 DIAGNOSIS — F418 Other specified anxiety disorders: Secondary | ICD-10-CM

## 2019-06-20 DIAGNOSIS — E669 Obesity, unspecified: Secondary | ICD-10-CM

## 2019-06-20 DIAGNOSIS — F40243 Fear of flying: Secondary | ICD-10-CM | POA: Diagnosis not present

## 2019-06-20 DIAGNOSIS — Z9989 Dependence on other enabling machines and devices: Secondary | ICD-10-CM

## 2019-06-20 LAB — BASIC METABOLIC PANEL
BUN: 18 mg/dL (ref 6–23)
CO2: 30 mEq/L (ref 19–32)
Calcium: 9.2 mg/dL (ref 8.4–10.5)
Chloride: 108 mEq/L (ref 96–112)
Creatinine, Ser: 1.29 mg/dL (ref 0.40–1.50)
GFR: 59.51 mL/min — ABNORMAL LOW (ref >60.00)
Glucose, Bld: 105 mg/dL — ABNORMAL HIGH (ref 70–99)
Potassium: 4.7 mEq/L (ref 3.5–5.1)
Sodium: 143 mEq/L (ref 135–145)

## 2019-06-20 LAB — LIPID PANEL
Cholesterol: 169 mg/dL (ref 0–200)
HDL: 39.1 mg/dL (ref >39.00)
LDL Cholesterol: 104 mg/dL — ABNORMAL HIGH (ref 0–99)
NonHDL: 130.34
Total CHOL/HDL Ratio: 4
Triglycerides: 132 mg/dL (ref 0.0–149.0)
VLDL: 26.4 mg/dL (ref 0.0–40.0)

## 2019-06-20 MED ORDER — SERTRALINE HCL 50 MG PO TABS: 50.0000 mg | ORAL_TABLET | Freq: Every day | ORAL | 3 refills | Status: DC

## 2019-06-20 MED ORDER — ALPRAZOLAM 0.25 MG PO TABS: 0.2500 mg | ORAL_TABLET | Freq: Every evening | ORAL | 0 refills | Status: DC | PRN

## 2019-06-20 MED ORDER — FLUTICASONE PROPIONATE 50 MCG/ACT NA SUSP: 2.0000 | Freq: Every day | NASAL | 3 refills | Status: DC

## 2019-06-20 NOTE — Assessment & Plan Note (Signed)
Preventative protocols reviewed and updated unless pt declined. Discussed healthy diet and lifestyle.  

## 2019-06-20 NOTE — Assessment & Plan Note (Signed)
Refilled xanax

## 2019-06-20 NOTE — Assessment & Plan Note (Signed)
Chronic off meds. Update FLP today.  The 10-year ASCVD risk score Mikey Bussing DC Brooke Bonito., et al., 2013) is: 3.4%   Values used to calculate the score:     Age: 48 years     Sex: Male     Is Non-Hispanic African American: No     Diabetic: No     Tobacco smoker: No     Systolic Blood Pressure: 154 mmHg     Is BP treated: No     HDL Cholesterol: 35.3 mg/dL     Total Cholesterol: 199 mg/dL

## 2019-06-20 NOTE — Assessment & Plan Note (Signed)
Continue to encourage healthy diet and lifestyle choices to affect sustainable weight loss.  °

## 2019-06-20 NOTE — Assessment & Plan Note (Signed)
Continues CPAP through pulm.

## 2019-06-20 NOTE — Assessment & Plan Note (Addendum)
Chronic, stable period. Mild. Continue sertraline with PRN xanax.

## 2019-06-20 NOTE — Assessment & Plan Note (Signed)
Refilled flonase which is effective.

## 2019-06-20 NOTE — Progress Notes (Signed)
This visit was conducted in person.  BP 120/80 (BP Location: Left Arm, Patient Position: Sitting, Cuff Size: Large)   Pulse 77   Temp 97.8 F (36.6 C) (Temporal)   Ht 5\' 5"  (1.651 m)   Wt 214 lb (97.1 kg)   SpO2 96%   BMI 35.61 kg/m    CC: CPE Subjective:    Patient ID: Nicholas Saunders, male    DOB: Aug 08, 1971, 48 y.o.   MRN: [id number]  HPI: Griffon Herberg is a 48 y.o. male presenting on 06/20/2019 for Annual Exam   On antidepressant for work related stressors, overall mild. Latest trial wellbutrin caused worsening restlessness/anxiety. Undergoing slow taper off this. celexa caused tachycardia. zoloft affected libido.   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   Preventative: Flu shot - did not receive Td 2006, Tdap 03/2014.  Hep A/B 03/2014, 04/2014 Seat belt use discussed Sunscreen use discussed. No changing moles on skin.  Non smoker Alcohol - 1-2 beer/wk Dentist Q6 months Eye doctor - yearly   Caffeine: 3-4 caffeinated beverages  Married. Lives with wife, 1 dog  No children  Edu: 12th grade  Occ: Theatre manager, works from home  Activity: walking dogs  Diet: some water, fruits/vegetables some       Relevant past medical, surgical, family and social history reviewed and updated as indicated. Interim medical history since our last visit reviewed. Allergies and medications reviewed and updated. Outpatient Medications Prior to Visit  Medication Sig Dispense Refill  . ALPRAZolam (XANAX) 0.25 MG tablet TAKE 1 TABLET BY MOUTH EVERY NIGHT AT BEDTIME AS NEEDED. 30 tablet 0  . fluticasone (FLONASE) 50 MCG/ACT nasal spray USE 2 SPRAYS INTO EACH NOSTRIL ONCE DAILY AS DIRECTED. 16 g 2  . sertraline (ZOLOFT) 50 MG tablet Take 1 tablet (50 mg total) by mouth daily. 30 tablet 9   Facility-Administered Medications Prior to Visit  Medication Dose Route Frequency Provider Last Rate Last Dose  . methylPREDNISolone acetate (DEPO-MEDROL) injection 40  mg  40 mg Intramuscular Once McLean-Scocuzza, Nino Glow, MD         Per HPI unless specifically indicated in ROS section below Review of Systems  Constitutional: Negative for activity change, appetite change, chills, fatigue, fever and unexpected weight change.  HENT: Negative for hearing loss.   Eyes: Negative for visual disturbance.  Respiratory: Negative for cough, chest tightness, shortness of breath and wheezing.   Cardiovascular: Negative for chest pain, palpitations and leg swelling.  Gastrointestinal: Positive for abdominal pain (occasional ?IBS related). Negative for abdominal distention, blood in stool, constipation, diarrhea, nausea and vomiting.  Genitourinary: Negative for difficulty urinating and hematuria.  Musculoskeletal: Negative for arthralgias, myalgias and neck pain.  Skin: Negative for rash.  Neurological: Negative for dizziness, seizures, syncope and headaches.  Hematological: Negative for adenopathy. Does not bruise/bleed easily.  Psychiatric/Behavioral: Negative for dysphoric mood. The patient is not nervous/anxious.    Objective:    BP 120/80 (BP Location: Left Arm, Patient Position: Sitting, Cuff Size: Large)   Pulse 77   Temp 97.8 F (36.6 C) (Temporal)   Ht 5\' 5"  (1.651 m)   Wt 214 lb (97.1 kg)   SpO2 96%   BMI 35.61 kg/m   Wt Readings from Last 3 Encounters:  06/20/19 214 lb (97.1 kg)  07/15/18 209 lb 12 oz (95.1 kg)  05/24/18 212 lb 12 oz (96.5 kg)    Physical Exam Vitals signs and nursing note reviewed.  Constitutional:      General: He  is not in acute distress.    Appearance: Normal appearance. He is well-developed. He is not ill-appearing.  HENT:     Head: Normocephalic and atraumatic.     Right Ear: Hearing, tympanic membrane, ear canal and external ear normal.     Left Ear: Hearing, tympanic membrane, ear canal and external ear normal.     Nose: Nose normal.     Mouth/Throat:     Mouth: Mucous membranes are moist.     Pharynx: Uvula  midline. No oropharyngeal exudate or posterior oropharyngeal erythema.  Eyes:     General: No scleral icterus.    Conjunctiva/sclera: Conjunctivae normal.     Pupils: Pupils are equal, round, and reactive to light.  Neck:     Musculoskeletal: Normal range of motion and neck supple.  Cardiovascular:     Rate and Rhythm: Normal rate and regular rhythm.     Pulses: Normal pulses.          Radial pulses are 2+ on the right side and 2+ on the left side.     Heart sounds: Normal heart sounds. No murmur.  Pulmonary:     Effort: Pulmonary effort is normal. No respiratory distress.     Breath sounds: Normal breath sounds. No wheezing, rhonchi or rales.  Abdominal:     General: Bowel sounds are normal. There is no distension.     Palpations: Abdomen is soft. There is no mass.     Tenderness: There is no abdominal tenderness. There is no guarding or rebound.  Musculoskeletal: Normal range of motion.  Lymphadenopathy:     Cervical: No cervical adenopathy.  Skin:    General: Skin is warm and dry.     Findings: No rash.  Neurological:     General: No focal deficit present.     Mental Status: He is alert and oriented to person, place, and time.     Comments: CN grossly intact, station and gait intact  Psychiatric:        Mood and Affect: Mood normal.        Behavior: Behavior normal.        Thought Content: Thought content normal.        Judgment: Judgment normal.       Results for orders placed or performed in visit on 05/24/18  Lipid panel  Result Value Ref Range   Cholesterol 199 0 - 200 mg/dL   Triglycerides 115.0 0.0 - 149.0 mg/dL   HDL 35.30 (L) >39.00 mg/dL   VLDL 23.0 0.0 - 40.0 mg/dL   LDL Cholesterol 141 (H) 0 - 99 mg/dL   Total CHOL/HDL Ratio 6    NonHDL 163.71   Comprehensive metabolic panel  Result Value Ref Range   Sodium 139 135 - 145 mEq/L   Potassium 4.1 3.5 - 5.1 mEq/L   Chloride 104 96 - 112 mEq/L   CO2 26 19 - 32 mEq/L   Glucose, Bld 86 70 - 99 mg/dL   BUN  15 6 - 23 mg/dL   Creatinine, Ser 1.06 0.40 - 1.50 mg/dL   Total Bilirubin 0.6 0.2 - 1.2 mg/dL   Alkaline Phosphatase 67 39 - 117 U/L   AST 19 0 - 37 U/L   ALT 21 0 - 53 U/L   Total Protein 7.1 6.0 - 8.3 g/dL   Albumin 4.4 3.5 - 5.2 g/dL   Calcium 9.2 8.4 - 10.5 mg/dL   GFR 79.71 >60.00 mL/min  Vitamin B12  Result Value Ref Range  Vitamin B-12 366 211 - 911 pg/mL   Assessment & Plan:   Problem List Items Addressed This Visit    Phobia, flying    Refilled xanax       Relevant Medications   ALPRAZolam (XANAX) 0.25 MG tablet   sertraline (ZOLOFT) 50 MG tablet   OSA on CPAP    Continues CPAP through pulm.       Obesity, Class II, BMI 35-39.9, no comorbidity    Continue to encourage healthy diet and lifestyle choices to affect sustainable weight loss.       HLD (hyperlipidemia)    Chronic off meds. Update FLP today.  The 10-year ASCVD risk score Mikey Bussing DC Brooke Bonito., et al., 2013) is: 3.4%   Values used to calculate the score:     Age: 8 years     Sex: Male     Is Non-Hispanic African American: No     Diabetic: No     Tobacco smoker: No     Systolic Blood Pressure: 944 mmHg     Is BP treated: No     HDL Cholesterol: 35.3 mg/dL     Total Cholesterol: 199 mg/dL       Relevant Orders   Lipid panel   Basic metabolic panel   Healthcare maintenance    Preventative protocols reviewed and updated unless pt declined. Discussed healthy diet and lifestyle.       Anxiety with depression    Chronic, stable period. Mild. Continue sertraline with PRN xanax.       Relevant Medications   ALPRAZolam (XANAX) 0.25 MG tablet   sertraline (ZOLOFT) 50 MG tablet   Allergic rhinitis    Refilled flonase which is effective.           Meds ordered this encounter  Medications  . ALPRAZolam (XANAX) 0.25 MG tablet    Sig: Take 1 tablet (0.25 mg total) by mouth at bedtime as needed.    Dispense:  30 tablet    Refill:  0    This request is for a new prescription for a controlled  substance as required by Federal/State law.  . fluticasone (FLONASE) 50 MCG/ACT nasal spray    Sig: Place 2 sprays into both nostrils daily.    Dispense:  48 g    Refill:  3  . sertraline (ZOLOFT) 50 MG tablet    Sig: Take 1 tablet (50 mg total) by mouth daily.    Dispense:  90 tablet    Refill:  3   Orders Placed This Encounter  Procedures  . Lipid panel  . Basic metabolic panel    Follow up plan: Return in about 1 year (around 06/19/2020) for annual exam, prior fasting for blood work.  Ria Bush, MD

## 2019-06-20 NOTE — Patient Instructions (Signed)
You are doing well today. Continue working on regular exercise routine.  Return as needed or in 1 year for next physical.  Health Maintenance, Male Adopting a healthy lifestyle and getting preventive care are important in promoting health and wellness. Ask your health care provider about:  The right schedule for you to have regular tests and exams.  Things you can do on your own to prevent diseases and keep yourself healthy. What should I know about diet, weight, and exercise? Eat a healthy diet   Eat a diet that includes plenty of vegetables, fruits, low-fat dairy products, and lean protein.  Do not eat a lot of foods that are high in solid fats, added sugars, or sodium. Maintain a healthy weight Body mass index (BMI) is a measurement that can be used to identify possible weight problems. It estimates body fat based on height and weight. Your health care provider can help determine your BMI and help you achieve or maintain a healthy weight. Get regular exercise Get regular exercise. This is one of the most important things you can do for your health. Most adults should:  Exercise for at least 150 minutes each week. The exercise should increase your heart rate and make you sweat (moderate-intensity exercise).  Do strengthening exercises at least twice a week. This is in addition to the moderate-intensity exercise.  Spend less time sitting. Even light physical activity can be beneficial. Watch cholesterol and blood lipids Have your blood tested for lipids and cholesterol at 48 years of age, then have this test every 5 years. You may need to have your cholesterol levels checked more often if:  Your lipid or cholesterol levels are high.  You are older than 48 years of age.  You are at high risk for heart disease. What should I know about cancer screening? Many types of cancers can be detected early and may often be prevented. Depending on your health history and family history, you  may need to have cancer screening at various ages. This may include screening for:  Colorectal cancer.  Prostate cancer.  Skin cancer.  Lung cancer. What should I know about heart disease, diabetes, and high blood pressure? Blood pressure and heart disease  High blood pressure causes heart disease and increases the risk of stroke. This is more likely to develop in people who have high blood pressure readings, are of African descent, or are overweight.  Talk with your health care provider about your target blood pressure readings.  Have your blood pressure checked: ? Every 3-5 years if you are 35-34 years of age. ? Every year if you are 84 years old or older.  If you are between the ages of 3 and 51 and are a current or former smoker, ask your health care provider if you should have a one-time screening for abdominal aortic aneurysm (AAA). Diabetes Have regular diabetes screenings. This checks your fasting blood sugar level. Have the screening done:  Once every three years after age 44 if you are at a normal weight and have a low risk for diabetes.  More often and at a younger age if you are overweight or have a high risk for diabetes. What should I know about preventing infection? Hepatitis B If you have a higher risk for hepatitis B, you should be screened for this virus. Talk with your health care provider to find out if you are at risk for hepatitis B infection. Hepatitis C Blood testing is recommended for:  Everyone born from  1945 through 64.  Anyone with known risk factors for hepatitis C. Sexually transmitted infections (STIs)  You should be screened each year for STIs, including gonorrhea and chlamydia, if: ? You are sexually active and are younger than 48 years of age. ? You are older than 48 years of age and your health care provider tells you that you are at risk for this type of infection. ? Your sexual activity has changed since you were last screened, and you  are at increased risk for chlamydia or gonorrhea. Ask your health care provider if you are at risk.  Ask your health care provider about whether you are at high risk for HIV. Your health care provider may recommend a prescription medicine to help prevent HIV infection. If you choose to take medicine to prevent HIV, you should first get tested for HIV. You should then be tested every 3 months for as long as you are taking the medicine. Follow these instructions at home: Lifestyle  Do not use any products that contain nicotine or tobacco, such as cigarettes, e-cigarettes, and chewing tobacco. If you need help quitting, ask your health care provider.  Do not use street drugs.  Do not share needles.  Ask your health care provider for help if you need support or information about quitting drugs. Alcohol use  Do not drink alcohol if your health care provider tells you not to drink.  If you drink alcohol: ? Limit how much you have to 0-2 drinks a day. ? Be aware of how much alcohol is in your drink. In the [place] one drink equals one 12 oz bottle of beer (355 mL), one 5 oz glass of wine (148 mL), or one 1 oz glass of hard liquor (44 mL). General instructions  Schedule regular health, dental, and eye exams.  Stay current with your vaccines.  Tell your health care provider if: ? You often feel depressed. ? You have ever been abused or do not feel safe at home. Summary  Adopting a healthy lifestyle and getting preventive care are important in promoting health and wellness.  Follow your health care provider's instructions about healthy diet, exercising, and getting tested or screened for diseases.  Follow your health care provider's instructions on monitoring your cholesterol and blood pressure. This information is not intended to replace advice given to you by your health care provider. Make sure you discuss any questions you have with your health care provider. Document Released:  05/28/2008 Document Revised: 11/23/2018 Document Reviewed: 11/23/2018 Elsevier Patient Education  2020 [employer].

## 2019-08-16 ENCOUNTER — Encounter: Payer: Self-pay | Admitting: Family Medicine

## 2019-08-16 MED ORDER — FLUTICASONE PROPIONATE 50 MCG/ACT NA SUSP: 2.0000 | Freq: Every day | NASAL | 11 refills | Status: DC

## 2019-08-16 NOTE — Telephone Encounter (Signed)
Changed Flonase rx to 30 day, per pt request, with 1 yr of refills.  E-scribed to [facility].  Notified pt via [place].

## 2020-03-26 ENCOUNTER — Other Ambulatory Visit: Payer: Self-pay | Admitting: Family Medicine

## 2020-03-26 NOTE — Telephone Encounter (Signed)
Name of Medication: Alprazolam Name of Pharmacy: [facility] Last Fill or Written Date and Quantity: 06/20/19, #30 Last Office Visit and Type: 06/20/19, CPE Next Office Visit and Type: none Last Controlled Substance Agreement Date: 03/28/14 Last UDS: 03/28/14

## 2020-03-27 ENCOUNTER — Encounter: Payer: Self-pay | Admitting: Family Medicine

## 2020-03-27 MED ORDER — ALPRAZOLAM 0.25 MG PO TABS: 0.2500 mg | ORAL_TABLET | Freq: Every evening | ORAL | 0 refills | Status: DC | PRN

## 2020-03-27 NOTE — Telephone Encounter (Signed)
ERx 

## 2020-05-06 ENCOUNTER — Encounter: Payer: Self-pay | Admitting: Family Medicine

## 2020-05-06 NOTE — Telephone Encounter (Signed)
Left message on vm per dpr asking pt to call our office to schedule OV.  FYI to Dr. Darnell Level.

## 2020-05-06 NOTE — Telephone Encounter (Signed)
Pt scheduled OV tomorrow at 9:30.

## 2020-05-06 NOTE — Telephone Encounter (Signed)
Agree - rec in-office eval for this.

## 2020-05-07 ENCOUNTER — Other Ambulatory Visit: Payer: Self-pay

## 2020-05-07 ENCOUNTER — Ambulatory Visit (INDEPENDENT_AMBULATORY_CARE_PROVIDER_SITE_OTHER): Payer: 59 | Admitting: Family Medicine

## 2020-05-07 ENCOUNTER — Encounter: Payer: Self-pay | Admitting: Family Medicine

## 2020-05-07 VITALS — BP 130/72 | HR 71 | Temp 97.6°F | Ht 65.0 in | Wt 224.4 lb

## 2020-05-07 DIAGNOSIS — K219 Gastro-esophageal reflux disease without esophagitis: Secondary | ICD-10-CM

## 2020-05-07 DIAGNOSIS — R6882 Decreased libido: Secondary | ICD-10-CM | POA: Diagnosis not present

## 2020-05-07 DIAGNOSIS — E7800 Pure hypercholesterolemia, unspecified: Secondary | ICD-10-CM | POA: Diagnosis not present

## 2020-05-07 DIAGNOSIS — R5383 Other fatigue: Secondary | ICD-10-CM

## 2020-05-07 DIAGNOSIS — R1011 Right upper quadrant pain: Secondary | ICD-10-CM

## 2020-05-07 MED ORDER — METHOCARBAMOL 500 MG PO TABS: 500.0000 mg | ORAL_TABLET | Freq: Three times a day (TID) | ORAL | 0 refills | Status: DC | PRN

## 2020-05-07 NOTE — Patient Instructions (Addendum)
Possible muscle strain around recent softball. Try muscle relaxant sent to pharmacy (robaxin).  For irritative cough - try pepcid or prilosec over the counter daily for 2 weeks.  Return at your convenience for 8am blood work.  Return after July for physical.

## 2020-05-07 NOTE — Progress Notes (Signed)
This visit was conducted in person.  BP 130/72 (BP Location: Left Arm, Patient Position: Sitting, Cuff Size: Large)   Pulse 71   Temp 97.6 F (36.4 C) (Temporal)   Ht 5\' 5"  (1.651 m)   Wt 224 lb 7 oz (101.8 kg)   SpO2 97%   BMI 37.35 kg/m    CC: ongoing abd pain Subjective:    Patient ID: Nicholas Saunders, male    DOB: 04-17-1971, 49 y.o.   MRN: [id number]  HPI: Nicholas Saunders is a 49 y.o. male presenting on 05/07/2020 for Abdominal Pain (C/o RUQ abd pain radiating down to right, lower back.  Denies any N/V/D.  Started 1-2 wks ago.  Also, c/o minor loss of appetite.  Pt mentioned wife recently had shingles. ) and Cough (C/o having to clear throat often.  Feels like something at back of throat. )   See recent mychart message - R upper abd discomfort that started 1-2 wks ago - described as dull/achey with intermittent sharp spells worse with certain movements. Feels better with pressure to area. Notes decreased appetite without fevers/chills, nausea/vomiting or diarrhea/constipation. Tried ibuprofen with some benefit. Not bothersome at night time. Not related to fatty greasy meals. No GERD symptoms besides irritant cough.  He has been playing more softball this spring - wonders if possible muscle strain contributing.   Normal RUQ [place] 05/2016.  Decreased libido noted. Increased stress with house changes. SIL just diagnosed with brain cancer.   Notes decreased libido and low energy levels. Lab Results  Component Value Date   TESTOSTERONE 338 04/05/2014       Relevant past medical, surgical, family and social history reviewed and updated as indicated. Interim medical history since our last visit reviewed. Allergies and medications reviewed and updated. Outpatient Medications Prior to Visit  Medication Sig Dispense Refill  . ALPRAZolam (XANAX) 0.25 MG tablet Take 1 tablet (0.25 mg total) by mouth at bedtime as needed. 30 tablet 0  . Cholecalciferol (VITAMIN D3) 50 MCG (2000 UT) TABS  Take 1 tablet by mouth daily.    . fluticasone (FLONASE) 50 MCG/ACT nasal spray Place 2 sprays into both nostrils daily. 16 g 11  . MISC NATURAL PRODUCTS PO Take 1,500 mg by mouth. Takes sea moss supplement     . sertraline (ZOLOFT) 50 MG tablet Take 1 tablet (50 mg total) by mouth daily. 90 tablet 3   Facility-Administered Medications Prior to Visit  Medication Dose Route Frequency Provider Last Rate Last Admin  . methylPREDNISolone acetate (DEPO-MEDROL) injection 40 mg  40 mg Intramuscular Once McLean-Scocuzza, Nino Glow, MD         Per HPI unless specifically indicated in ROS section below Review of Systems Objective:  BP 130/72 (BP Location: Left Arm, Patient Position: Sitting, Cuff Size: Large)   Pulse 71   Temp 97.6 F (36.4 C) (Temporal)   Ht 5\' 5"  (1.651 m)   Wt 224 lb 7 oz (101.8 kg)   SpO2 97%   BMI 37.35 kg/m   Wt Readings from Last 3 Encounters:  05/07/20 224 lb 7 oz (101.8 kg)  06/20/19 214 lb (97.1 kg)  07/15/18 209 lb 12 oz (95.1 kg)      Physical Exam Vitals and nursing note reviewed.  Constitutional:      General: He is not in acute distress.    Appearance: He is well-developed. He is not ill-appearing.  Eyes:     Extraocular Movements: Extraocular movements intact.     Pupils: Pupils are  equal, round, and reactive to light.  Cardiovascular:     Rate and Rhythm: Normal rate and regular rhythm.     Pulses: Normal pulses.     Heart sounds: Normal heart sounds. No murmur.  Pulmonary:     Effort: Pulmonary effort is normal. No respiratory distress.     Breath sounds: Normal breath sounds. No wheezing, rhonchi or rales.  Abdominal:     General: Abdomen is flat. Bowel sounds are normal. There is no distension.     Palpations: Abdomen is soft. There is no mass.     Tenderness: There is abdominal tenderness (mild) in the right upper quadrant. There is no right CVA tenderness, left CVA tenderness, guarding or rebound. Negative signs include Murphy's sign.      Hernia: No hernia is present.     Comments: No pain at lower ribcage on right, no significant pain to palpation of gallbladder or liver, rather just inferior to this is area of reproducible discomfort, not reproducible with activation of abdominal wall muscles when sitting up from supine position  Musculoskeletal:     Right lower leg: No edema.     Left lower leg: No edema.  Skin:    General: Skin is warm and dry.     Findings: No rash.  Neurological:     Mental Status: He is alert.  Psychiatric:        Behavior: Behavior normal.       Assessment & Plan:  This visit occurred during the SARS-CoV-2 public health emergency.  Safety protocols were in place, including screening questions prior to the visit, additional usage of staff PPE, and extensive cleaning of exam room while observing appropriate contact time as indicated for disinfecting solutions.   Problem List Items Addressed This Visit    RUQ abdominal pain - Primary    Overall reassuring exam, not consistent with gallstone pain, anticipate MSK cause like abd wall strain (with increased softball recently) less likely fatty liver causing capsular pressure. Supportive care reviewed with ibuprofen, heating pad, will Rx robaxin muscle relaxant trial, update if not improving with this.       Relevant Orders   Comprehensive metabolic panel   CBC with Differential/Platelet   HLD (hyperlipidemia)   Relevant Orders   Comprehensive metabolic panel   Lipid panel   TSH   GERD (gastroesophageal reflux disease)    Possible cause of irritative cough - suggested 2 wk trial of pepcid or prilosec OTC, update with effect.       Decreased libido    Increasing trouble with this noted, associated with low energy levels. Will check testosterone levels at next AM labs. labwork ordered.       Relevant Orders   Testos,Total,Free and SHBG (Male)    Other Visit Diagnoses    Low energy       Relevant Orders   Vitamin B12       Meds ordered  this encounter  Medications  . methocarbamol (ROBAXIN) 500 MG tablet    Sig: Take 1 tablet (500 mg total) by mouth 3 (three) times daily as needed for muscle spasms.    Dispense:  30 tablet    Refill:  0   Orders Placed This Encounter  Procedures  . Testos,Total,Free and SHBG (Male)    Standing Status:   Future    Standing Expiration Date:   05/08/2021  . Comprehensive metabolic panel    Standing Status:   Future    Standing Expiration Date:  05/08/2021  . Lipid panel    Standing Status:   Future    Standing Expiration Date:   05/08/2021  . TSH    Standing Status:   Future    Standing Expiration Date:   05/08/2021  . CBC with Differential/Platelet    Standing Status:   Future    Standing Expiration Date:   05/08/2021  . Vitamin B12    Standing Status:   Future    Standing Expiration Date:   05/08/2021   Patient Instructions  Possible muscle strain around recent softball. Try muscle relaxant sent to pharmacy (robaxin).  For irritative cough - try pepcid or prilosec over the counter daily for 2 weeks.  Return at your convenience for 8am blood work.  Return after July for physical.    Follow up plan: Return if symptoms worsen or fail to improve.  Ria Bush, MD

## 2020-05-08 ENCOUNTER — Encounter: Payer: Self-pay | Admitting: Family Medicine

## 2020-05-08 DIAGNOSIS — R1011 Right upper quadrant pain: Secondary | ICD-10-CM | POA: Insufficient documentation

## 2020-05-08 DIAGNOSIS — R6882 Decreased libido: Secondary | ICD-10-CM | POA: Insufficient documentation

## 2020-05-08 NOTE — Assessment & Plan Note (Signed)
Increasing trouble with this noted, associated with low energy levels. Will check testosterone levels at next AM labs. labwork ordered.

## 2020-05-08 NOTE — Assessment & Plan Note (Signed)
Possible cause of irritative cough - suggested 2 wk trial of pepcid or prilosec OTC, update with effect.

## 2020-05-08 NOTE — Assessment & Plan Note (Signed)
Overall reassuring exam, not consistent with gallstone pain, anticipate MSK cause like abd wall strain (with increased softball recently) less likely fatty liver causing capsular pressure. Supportive care reviewed with ibuprofen, heating pad, will Rx robaxin muscle relaxant trial, update if not improving with this.

## 2020-05-10 ENCOUNTER — Encounter: Payer: Self-pay | Admitting: Family Medicine

## 2020-05-10 NOTE — Telephone Encounter (Signed)
First day of symptoms 05/08/2020.  Please place on COVID call list and call on Tuesday for update on symptoms

## 2020-05-14 ENCOUNTER — Telehealth: Payer: Self-pay

## 2020-05-14 ENCOUNTER — Encounter: Payer: Self-pay | Admitting: Family Medicine

## 2020-05-14 NOTE — Telephone Encounter (Signed)
Lvm asking pt to call back.  Need updated on COVID sxs.

## 2020-05-14 NOTE — Telephone Encounter (Addendum)
Lvm asking pt to call back.  Nicholas Saunders TE, 05/14/20]

## 2020-05-14 NOTE — Telephone Encounter (Signed)
Noted.  FYI to Dr. G. 

## 2020-05-14 NOTE — Telephone Encounter (Addendum)
Spoke with pt relaying Dr. Synthia Innocent message. Verbalizes understanding.  Says he wants to look into the infusion and will call back to let Dr. Darnell Level know either way.   Pt added to Call List for Fri, 05/17/20.  [see Pt Msg, 05/14/20 concerning infusion]

## 2020-05-14 NOTE — Telephone Encounter (Signed)
Glad to hear. plz notify - he is also eligible for covid antibody infusion. Given significant improvement likely not necessary - but would give extra protection and if interested, plz sign him up for this.  plz call Friday for one final update.

## 2020-05-14 NOTE — Telephone Encounter (Signed)
Patient contacted the office to return Lisa's call. He states that he feels the best he has felt, and really feels like he is making progress. Patient states he still has a litle fatigue, but no fever, shortness of breath, cough, or any other covid sx. Will route to [place].

## 2020-05-14 NOTE — Telephone Encounter (Signed)
Noted.  Pt added to Call List.

## 2020-05-15 ENCOUNTER — Telehealth: Payer: Self-pay | Admitting: Nurse Practitioner

## 2020-05-15 NOTE — Telephone Encounter (Signed)
Called to discuss with Nicholas Saunders about Covid symptoms and the use of  bamlanivimab/etesevimab or casirivimab/imdevimab, a combination monoclonal antibody infusion for those with mild to moderate Covid symptoms and at a high risk of hospitalization.     Pt is qualified for this infusion at the [facility] infusion center due to co-morbid conditions (BMI >35).   Referral received from patient's PCP's office: Dr. Danise Mina [phone]).  Staff confirmed positive case on referral.   Unable to reach. MyChart message was sent.   Patient Active Problem List   Diagnosis Date Noted   RUQ abdominal pain 05/08/2020   Decreased libido 05/08/2020   HLD (hyperlipidemia) 05/27/2018   OSA on CPAP 07/23/2016   Obesity, Class II, BMI 35-39.9, no comorbidity 09/06/2014   GERD (gastroesophageal reflux disease) 03/23/2014   Anxiety with depression 12/31/2011   Healthcare maintenance 06/19/2011   Allergic rhinitis 04/20/2008   Phobia, flying 07/19/2007    Nicholas Saunders, AGPCNP-BC Pager: [phone] Amion: N. Cous[place]

## 2020-05-15 NOTE — Telephone Encounter (Signed)
Noted.  Lvm on infusion hotline with pt's info.

## 2020-05-15 NOTE — Telephone Encounter (Signed)
plz call infusion treatment team to report patient info to them as he would be interested in infusion.  Infusion team #: [phone]

## 2020-05-17 ENCOUNTER — Other Ambulatory Visit (INDEPENDENT_AMBULATORY_CARE_PROVIDER_SITE_OTHER): Payer: 59

## 2020-05-17 ENCOUNTER — Other Ambulatory Visit: Payer: Self-pay

## 2020-05-17 DIAGNOSIS — R5383 Other fatigue: Secondary | ICD-10-CM | POA: Diagnosis not present

## 2020-05-17 DIAGNOSIS — R1011 Right upper quadrant pain: Secondary | ICD-10-CM

## 2020-05-17 DIAGNOSIS — R6882 Decreased libido: Secondary | ICD-10-CM

## 2020-05-17 DIAGNOSIS — E7800 Pure hypercholesterolemia, unspecified: Secondary | ICD-10-CM

## 2020-05-17 LAB — LIPID PANEL
Cholesterol: 158 mg/dL (ref 0–200)
HDL: 37.3 mg/dL — ABNORMAL LOW (ref >39.00)
LDL Cholesterol: 98 mg/dL (ref 0–99)
NonHDL: 121.05
Total CHOL/HDL Ratio: 4
Triglycerides: 116 mg/dL (ref 0.0–149.0)
VLDL: 23.2 mg/dL (ref 0.0–40.0)

## 2020-05-17 LAB — CBC WITH DIFFERENTIAL/PLATELET
Basophils Absolute: 0 10*3/uL (ref 0.0–0.1)
Basophils Relative: 0.7 % (ref 0.0–3.0)
Eosinophils Absolute: 0.1 10*3/uL (ref 0.0–0.7)
Eosinophils Relative: 2.3 % (ref 0.0–5.0)
HCT: 43.4 % (ref 39.0–52.0)
Hemoglobin: 15.4 g/dL (ref 13.0–17.0)
Lymphocytes Relative: 34.8 % (ref 12.0–46.0)
Lymphs Abs: 1.5 10*3/uL (ref 0.7–4.0)
MCHC: 35.4 g/dL (ref 30.0–36.0)
MCV: 90.5 fl (ref 78.0–100.0)
Monocytes Absolute: 0.4 10*3/uL (ref 0.1–1.0)
Monocytes Relative: 9 % (ref 3.0–12.0)
Neutro Abs: 2.2 10*3/uL (ref 1.4–7.7)
Neutrophils Relative %: 53.2 % (ref 43.0–77.0)
Platelets: 215 10*3/uL (ref 150.0–400.0)
RBC: 4.79 Mil/uL (ref 4.22–5.81)
RDW: 13.9 % (ref 11.5–15.5)
WBC: 4.2 10*3/uL (ref 4.0–10.5)

## 2020-05-17 LAB — COMPREHENSIVE METABOLIC PANEL
ALT: 38 U/L (ref 0–53)
AST: 21 U/L (ref 0–37)
Albumin: 4.3 g/dL (ref 3.5–5.2)
Alkaline Phosphatase: 61 U/L (ref 39–117)
BUN: 16 mg/dL (ref 6–23)
CO2: 25 mEq/L (ref 19–32)
Calcium: 8.7 mg/dL (ref 8.4–10.5)
Chloride: 106 mEq/L (ref 96–112)
Creatinine, Ser: 1.07 mg/dL (ref 0.40–1.50)
GFR: 73.56 mL/min (ref >60.00)
Glucose, Bld: 106 mg/dL — ABNORMAL HIGH (ref 70–99)
Potassium: 3.7 mEq/L (ref 3.5–5.1)
Sodium: 142 mEq/L (ref 135–145)
Total Bilirubin: 0.4 mg/dL (ref 0.2–1.2)
Total Protein: 6.7 g/dL (ref 6.0–8.3)

## 2020-05-17 LAB — TSH: TSH: 2.76 u[IU]/mL (ref 0.35–4.50)

## 2020-05-17 LAB — VITAMIN B12: Vitamin B-12: 286 pg/mL (ref 211–911)

## 2020-05-17 NOTE — Telephone Encounter (Signed)
Spoke with pt to see how he's doing.  Says other than a somewhat stuffy nose and occasional sneezing he's doing great.

## 2020-05-17 NOTE — Telephone Encounter (Signed)
Great to hear.  Let's call him one more time on Monday and if doing well may d/c calls as that will be past 10 day mark.

## 2020-05-17 NOTE — Telephone Encounter (Signed)
Noted  

## 2020-05-20 NOTE — Telephone Encounter (Signed)
Spoke with pt for an update.  Says he doing fine and stuffy nose is just about gone.  FYI to Dr. Darnell Level.

## 2020-05-21 ENCOUNTER — Encounter: Payer: Self-pay | Admitting: Family Medicine

## 2020-05-22 LAB — TESTOS,TOTAL,FREE AND SHBG (FEMALE)
Free Testosterone: 69.1 pg/mL (ref 35.0–155.0)
Sex Hormone Binding: 22 nmol/L (ref 10–50)
Testosterone, Total, LC-MS-MS: 386 ng/dL (ref 250–1100)

## 2020-07-03 ENCOUNTER — Other Ambulatory Visit: Payer: Self-pay | Admitting: Family Medicine

## 2020-07-04 NOTE — Telephone Encounter (Signed)
E-scribed refill 

## 2020-07-10 ENCOUNTER — Other Ambulatory Visit: Payer: Self-pay | Admitting: Family Medicine

## 2020-07-10 NOTE — Telephone Encounter (Signed)
Please call patient and schedule physical appointment. Send back to CMA after appointment is scheduled.

## 2020-07-15 NOTE — Telephone Encounter (Signed)
Patient left a voicemail regarding a refill on his Zoloft. Last office visit 05/07/20  Last refill 06/20/19 #90/3 See allergy/contraindication Sent to [place] to schedule appointment

## 2020-07-15 NOTE — Telephone Encounter (Signed)
Patient has left a voicemail regarding this refill. Please call patient and schedule appointment as instructed then send back for refill.

## 2020-07-15 NOTE — Telephone Encounter (Signed)
Called and got patient scheduled for CPE and labs. Patient stated he is running low on Zoloft and would need a refill sent asap.

## 2020-07-15 NOTE — Telephone Encounter (Signed)
E-scribed refill 

## 2020-07-29 ENCOUNTER — Other Ambulatory Visit: Payer: Self-pay | Admitting: Family Medicine

## 2020-07-29 ENCOUNTER — Encounter: Payer: Self-pay | Admitting: Family Medicine

## 2020-07-29 DIAGNOSIS — E7800 Pure hypercholesterolemia, unspecified: Secondary | ICD-10-CM

## 2020-07-30 ENCOUNTER — Other Ambulatory Visit: Payer: 59

## 2020-08-06 ENCOUNTER — Encounter: Payer: Self-pay | Admitting: Family Medicine

## 2020-08-06 ENCOUNTER — Ambulatory Visit (INDEPENDENT_AMBULATORY_CARE_PROVIDER_SITE_OTHER): Payer: 59 | Admitting: Family Medicine

## 2020-08-06 ENCOUNTER — Other Ambulatory Visit: Payer: Self-pay

## 2020-08-06 VITALS — BP 136/64 | HR 98 | Temp 98.1°F | Ht 65.0 in | Wt 228.1 lb

## 2020-08-06 DIAGNOSIS — F418 Other specified anxiety disorders: Secondary | ICD-10-CM

## 2020-08-06 DIAGNOSIS — R7303 Prediabetes: Secondary | ICD-10-CM

## 2020-08-06 DIAGNOSIS — Z0000 Encounter for general adult medical examination without abnormal findings: Secondary | ICD-10-CM

## 2020-08-06 DIAGNOSIS — F40243 Fear of flying: Secondary | ICD-10-CM | POA: Diagnosis not present

## 2020-08-06 DIAGNOSIS — Z9989 Dependence on other enabling machines and devices: Secondary | ICD-10-CM

## 2020-08-06 DIAGNOSIS — G4733 Obstructive sleep apnea (adult) (pediatric): Secondary | ICD-10-CM

## 2020-08-06 DIAGNOSIS — R739 Hyperglycemia, unspecified: Secondary | ICD-10-CM | POA: Diagnosis not present

## 2020-08-06 DIAGNOSIS — Z1211 Encounter for screening for malignant neoplasm of colon: Secondary | ICD-10-CM

## 2020-08-06 DIAGNOSIS — E7800 Pure hypercholesterolemia, unspecified: Secondary | ICD-10-CM

## 2020-08-06 DIAGNOSIS — E669 Obesity, unspecified: Secondary | ICD-10-CM

## 2020-08-06 DIAGNOSIS — E538 Deficiency of other specified B group vitamins: Secondary | ICD-10-CM

## 2020-08-06 LAB — POCT GLYCOSYLATED HEMOGLOBIN (HGB A1C): Hemoglobin A1C: 5.7 % — AB (ref 4.0–5.6)

## 2020-08-06 MED ORDER — B-12 1000 MCG SL SUBL: 1.0000 | SUBLINGUAL_TABLET | Freq: Every day | SUBLINGUAL | Status: DC

## 2020-08-06 NOTE — Assessment & Plan Note (Signed)
Chronic, stable on sertraline - continue.

## 2020-08-06 NOTE — Patient Instructions (Addendum)
A1c today 5.7% (starting stages of prediabetes) Pass by lab to pick up stool kit.  Increase water intake - goal 16-32 oz/day.  Work on exercise routine for goal weight loss.  Good to see you today Return as needed or in 1 year for next physical.   Health Maintenance, Male Adopting a healthy lifestyle and getting preventive care are important in promoting health and wellness. Ask your health care provider about:  The right schedule for you to have regular tests and exams.  Things you can do on your own to prevent diseases and keep yourself healthy. What should I know about diet, weight, and exercise? Eat a healthy diet   Eat a diet that includes plenty of vegetables, fruits, low-fat dairy products, and lean protein.  Do not eat a lot of foods that are high in solid fats, added sugars, or sodium. Maintain a healthy weight Body mass index (BMI) is a measurement that can be used to identify possible weight problems. It estimates body fat based on height and weight. Your health care provider can help determine your BMI and help you achieve or maintain a healthy weight. Get regular exercise Get regular exercise. This is one of the most important things you can do for your health. Most adults should:  Exercise for at least 150 minutes each week. The exercise should increase your heart rate and make you sweat (moderate-intensity exercise).  Do strengthening exercises at least twice a week. This is in addition to the moderate-intensity exercise.  Spend less time sitting. Even light physical activity can be beneficial. Watch cholesterol and blood lipids Have your blood tested for lipids and cholesterol at 49 years of age, then have this test every 5 years. You may need to have your cholesterol levels checked more often if:  Your lipid or cholesterol levels are high.  You are older than 49 years of age.  You are at high risk for heart disease. What should I know about cancer  screening? Many types of cancers can be detected early and may often be prevented. Depending on your health history and family history, you may need to have cancer screening at various ages. This may include screening for:  Colorectal cancer.  Prostate cancer.  Skin cancer.  Lung cancer. What should I know about heart disease, diabetes, and high blood pressure? Blood pressure and heart disease  High blood pressure causes heart disease and increases the risk of stroke. This is more likely to develop in people who have high blood pressure readings, are of African descent, or are overweight.  Talk with your health care provider about your target blood pressure readings.  Have your blood pressure checked: ? Every 3-5 years if you are 79-80 years of age. ? Every year if you are 84 years old or older.  If you are between the ages of 26 and 71 and are a current or former smoker, ask your health care provider if you should have a one-time screening for abdominal aortic aneurysm (AAA). Diabetes Have regular diabetes screenings. This checks your fasting blood sugar level. Have the screening done:  Once every three years after age 28 if you are at a normal weight and have a low risk for diabetes.  More often and at a younger age if you are overweight or have a high risk for diabetes. What should I know about preventing infection? Hepatitis B If you have a higher risk for hepatitis B, you should be screened for this virus. Talk with  your health care provider to find out if you are at risk for hepatitis B infection. Hepatitis C Blood testing is recommended for:  Everyone born from [birth year] through 1965.  Anyone with known risk factors for hepatitis C. Sexually transmitted infections (STIs)  You should be screened each year for STIs, including gonorrhea and chlamydia, if: ? You are sexually active and are younger than 49 years of age. ? You are older than 49 years of age and your health care  provider tells you that you are at risk for this type of infection. ? Your sexual activity has changed since you were last screened, and you are at increased risk for chlamydia or gonorrhea. Ask your health care provider if you are at risk.  Ask your health care provider about whether you are at high risk for HIV. Your health care provider may recommend a prescription medicine to help prevent HIV infection. If you choose to take medicine to prevent HIV, you should first get tested for HIV. You should then be tested every 3 months for as long as you are taking the medicine. Follow these instructions at home: Lifestyle  Do not use any products that contain nicotine or tobacco, such as cigarettes, e-cigarettes, and chewing tobacco. If you need help quitting, ask your health care provider.  Do not use street drugs.  Do not share needles.  Ask your health care provider for help if you need support or information about quitting drugs. Alcohol use  Do not drink alcohol if your health care provider tells you not to drink.  If you drink alcohol: ? Limit how much you have to 0-2 drinks a day. ? Be aware of how much alcohol is in your drink. In the [place] one drink equals one 12 oz bottle of beer (355 mL), one 5 oz glass of wine (148 mL), or one 1 oz glass of hard liquor (44 mL). General instructions  Schedule regular health, dental, and eye exams.  Stay current with your vaccines.  Tell your health care provider if: ? You often feel depressed. ? You have ever been abused or do not feel safe at home. Summary  Adopting a healthy lifestyle and getting preventive care are important in promoting health and wellness.  Follow your health care provider's instructions about healthy diet, exercising, and getting tested or screened for diseases.  Follow your health care provider's instructions on monitoring your cholesterol and blood pressure. This information is not intended to replace advice given  to you by your health care provider. Make sure you discuss any questions you have with your health care provider. Document Revised: 11/23/2018 Document Reviewed: 11/23/2018 Elsevier Patient Education  2020 [employer].

## 2020-08-06 NOTE — Assessment & Plan Note (Signed)
A1c reviewed - encouraged weight loss to help reverse hyperglycemia

## 2020-08-06 NOTE — Assessment & Plan Note (Signed)
Was on CPAP followed by pulm last seen 2018.

## 2020-08-06 NOTE — Progress Notes (Signed)
This visit was conducted in person.  BP 136/64 (BP Location: Left Arm, Patient Position: Sitting, Cuff Size: Large)   Pulse 98   Temp 98.1 F (36.7 C) (Temporal)   Ht '5\' 5"'  (1.651 m)   Wt 228 lb 1 oz (103.4 kg)   SpO2 96%   BMI 37.95 kg/m    CC: CPE  Subjective:    Patient ID: Nycholas Rayner, male    DOB: 1971-05-20, 49 y.o.   MRN: [id number]  HPI: Ananias Kolander is a 49 y.o. male presenting on 08/06/2020 for Annual Exam   Just moved into new house yesterday. Prolonged process.  Weight gain noted. Restarting softball. Previously on phentermine with effect.   Preventative: Colon cancer screening - discussed. Would like iFOB.  Flu shot - declines COVID vaccine - discussed, declines  Td 2006, Tdap 03/2014.  Hep A/B 03/2014, 04/2014 Seat belt use discussed  Sunscreen use discussed. No changing moles on skin.  Non smoker  Alcohol - 1-2 beer/wk  Dentist Q6 months Eye doctor - yearly   Caffeine: 3-4 caffeinated beverages  Married. Lives with wife, 1 dog  No children  Edu: 12th grade  Occ: Theatre manager, works from home  Activity: walking dogs, softball  Diet: some water, fruits/vegetables some      Relevant past medical, surgical, family and social history reviewed and updated as indicated. Interim medical history since our last visit reviewed. Allergies and medications reviewed and updated. Outpatient Medications Prior to Visit  Medication Sig Dispense Refill  . ALPRAZolam (XANAX) 0.25 MG tablet Take 1 tablet (0.25 mg total) by mouth at bedtime as needed. 30 tablet 0  . Cholecalciferol (VITAMIN D3) 50 MCG (2000 UT) TABS Take 1 tablet by mouth daily.    . fluticasone (FLONASE) 50 MCG/ACT nasal spray SPRAY 2 SPRAYS INTO EACH NOSTRIL EVERY DAY 16 mL 0  . sertraline (ZOLOFT) 50 MG tablet TAKE 1 TABLET BY MOUTH EVERY DAY 90 tablet 0  . MISC NATURAL PRODUCTS PO Take 1,500 mg by mouth. Takes sea moss supplement     . methocarbamol (ROBAXIN) 500 MG tablet Take 1  tablet (500 mg total) by mouth 3 (three) times daily as needed for muscle spasms. 30 tablet 0   Facility-Administered Medications Prior to Visit  Medication Dose Route Frequency Provider Last Rate Last Admin  . methylPREDNISolone acetate (DEPO-MEDROL) injection 40 mg  40 mg Intramuscular Once McLean-Scocuzza, Nino Glow, MD         Per HPI unless specifically indicated in ROS section below Review of Systems  Constitutional: Negative for activity change, appetite change, chills, fatigue, fever and unexpected weight change.  HENT: Negative for hearing loss.   Eyes: Negative for visual disturbance.  Respiratory: Negative for cough, chest tightness, shortness of breath and wheezing.   Cardiovascular: Negative for chest pain, palpitations and leg swelling.  Gastrointestinal: Negative for abdominal distention, abdominal pain, blood in stool, constipation, diarrhea, nausea and vomiting.  Genitourinary: Negative for difficulty urinating and hematuria.  Musculoskeletal: Negative for arthralgias, myalgias and neck pain.  Skin: Negative for rash.  Neurological: Negative for dizziness, seizures, syncope and headaches.  Hematological: Negative for adenopathy. Does not bruise/bleed easily.  Psychiatric/Behavioral: Negative for dysphoric mood. The patient is not nervous/anxious.    Objective:  BP 136/64 (BP Location: Left Arm, Patient Position: Sitting, Cuff Size: Large)   Pulse 98   Temp 98.1 F (36.7 C) (Temporal)   Ht '5\' 5"'  (1.651 m)   Wt 228 lb 1 oz (103.4 kg)  SpO2 96%   BMI 37.95 kg/m   Wt Readings from Last 3 Encounters:  08/06/20 228 lb 1 oz (103.4 kg)  05/07/20 224 lb 7 oz (101.8 kg)  06/20/19 214 lb (97.1 kg)      Physical Exam Vitals and nursing note reviewed.  Constitutional:      General: He is not in acute distress.    Appearance: Normal appearance. He is well-developed. He is not ill-appearing.  HENT:     Head: Normocephalic and atraumatic.     Right Ear: Hearing, tympanic  membrane, ear canal and external ear normal.     Left Ear: Hearing, tympanic membrane, ear canal and external ear normal.  Eyes:     General: No scleral icterus.    Extraocular Movements: Extraocular movements intact.     Conjunctiva/sclera: Conjunctivae normal.     Pupils: Pupils are equal, round, and reactive to light.  Cardiovascular:     Rate and Rhythm: Normal rate and regular rhythm.     Pulses: Normal pulses.          Radial pulses are 2+ on the right side and 2+ on the left side.     Heart sounds: Normal heart sounds. No murmur heard.   Pulmonary:     Effort: Pulmonary effort is normal. No respiratory distress.     Breath sounds: Normal breath sounds. No wheezing, rhonchi or rales.  Abdominal:     General: Abdomen is flat. Bowel sounds are normal. There is no distension.     Palpations: Abdomen is soft. There is no mass.     Tenderness: There is no abdominal tenderness. There is no guarding or rebound.     Hernia: No hernia is present.  Musculoskeletal:        General: Normal range of motion.     Cervical back: Normal range of motion and neck supple.     Right lower leg: No edema.     Left lower leg: No edema.  Lymphadenopathy:     Cervical: No cervical adenopathy.  Skin:    General: Skin is warm and dry.     Findings: No rash.  Neurological:     General: No focal deficit present.     Mental Status: He is alert and oriented to person, place, and time.     Comments: CN grossly intact, station and gait intact  Psychiatric:        Mood and Affect: Mood normal.        Behavior: Behavior normal.        Thought Content: Thought content normal.        Judgment: Judgment normal.       Results for orders placed or performed in visit on 08/06/20  POCT glycosylated hemoglobin (Hb A1C)  Result Value Ref Range   Hemoglobin A1C 5.7 (A) 4.0 - 5.6 %   HbA1c POC (<> result, manual entry)     HbA1c, POC (prediabetic range)     HbA1c, POC (controlled diabetic range)    No  results found for: PSA1, PSA Depression screen [facility] 2/9 08/06/2020 06/20/2019 07/15/2018 05/24/2018 05/24/2018  Decreased Interest 0 0 0 0 0  Down, Depressed, Hopeless 0 0 0 0 0  PHQ - 2 Score 0 0 0 0 0  Altered sleeping 0 - 0 0 -  Tired, decreased energy 1 - 1 1 -  Change in appetite 1 - 0 0 -  Feeling bad or failure about yourself  0 - 1 1 -  Trouble concentrating 2 - 1 1 -  Moving slowly or fidgety/restless 0 - 0 0 -  Suicidal thoughts 0 - 0 0 -  PHQ-9 Score 4 - 3 3 -    GAD 7 : Generalized Anxiety Score 08/06/2020 07/15/2018 05/24/2018  Nervous, Anxious, on Edge 0 2 1  Control/stop worrying 0 2 1  Worry too much - different things '1 2 2  ' Trouble relaxing 0 1 0  Restless '1 1 1  ' Easily annoyed or irritable 0 0 0  Afraid - awful might happen 0 2 1  Total GAD 7 Score '2 10 6   ' Assessment & Plan:  This visit occurred during the SARS-CoV-2 public health emergency.  Safety protocols were in place, including screening questions prior to the visit, additional usage of staff PPE, and extensive cleaning of exam room while observing appropriate contact time as indicated for disinfecting solutions.   Problem List Items Addressed This Visit    Prediabetes    A1c reviewed - encouraged weight loss to help reverse hyperglycemia      Phobia, flying    Continue rare xanax use.       OSA on CPAP    Was on CPAP followed by pulm last seen 2018.      Obesity, Class II, BMI 35-39.9, no comorbidity    Ongoing weight gain noted. Encouraged healthy diet and lifestyle choices to affect sustainable weight loss.       Low serum vitamin B12    Continue vit b12 1073mg SL daily.      HLD (hyperlipidemia)    Chronic, stable to improved off meds.       Healthcare maintenance - Primary    Preventative protocols reviewed and updated unless pt declined. Discussed healthy diet and lifestyle.  Discussed COVID vaccination - encouraged he get this.       Anxiety with depression    Chronic, stable on  sertraline - continue.        Other Visit Diagnoses    Hyperglycemia       Relevant Orders   POCT glycosylated hemoglobin (Hb A1C) (Completed)   Special screening for malignant neoplasms, colon       Relevant Orders   Fecal occult blood, imunochemical       Meds ordered this encounter  Medications  . Cyanocobalamin (B-12) 1000 MCG SUBL    Sig: Place 1 tablet under the tongue daily.   Orders Placed This Encounter  Procedures  . Fecal occult blood, imunochemical    Standing Status:   Future    Standing Expiration Date:   08/06/2021  . POCT glycosylated hemoglobin (Hb A1C)    Patient instructions: A1c today 5.7% (starting stages of prediabetes) Pass by lab to pick up stool kit.  Increase water intake - goal 16-32 oz/day.  Work on exercise routine for goal weight loss.  Good to see you today Return as needed or in 1 year for next physical.   Follow up plan: Return in about 1 year (around 08/06/2021), or if symptoms worsen or fail to improve, for annual exam, prior fasting for blood work.  JRia Bush MD

## 2020-08-06 NOTE — Assessment & Plan Note (Signed)
Continue rare xanax use.

## 2020-08-06 NOTE — Assessment & Plan Note (Addendum)
Preventative protocols reviewed and updated unless pt declined. Discussed healthy diet and lifestyle.  Discussed COVID vaccination - encouraged he get this.

## 2020-08-06 NOTE — Assessment & Plan Note (Addendum)
Ongoing weight gain noted. Encouraged healthy diet and lifestyle choices to affect sustainable weight loss.

## 2020-08-06 NOTE — Assessment & Plan Note (Addendum)
Chronic, stable to improved off meds.

## 2020-08-06 NOTE — Assessment & Plan Note (Signed)
Continue vit b12 1071mcg SL daily.

## 2020-08-21 ENCOUNTER — Other Ambulatory Visit: Payer: Self-pay | Admitting: Family Medicine

## 2020-09-05 ENCOUNTER — Other Ambulatory Visit: Payer: Self-pay | Admitting: Family Medicine

## 2020-11-21 ENCOUNTER — Encounter: Payer: 59 | Admitting: Dermatology

## 2021-05-22 ENCOUNTER — Other Ambulatory Visit: Payer: Self-pay | Admitting: Family Medicine

## 2021-05-22 MED ORDER — ALPRAZOLAM 0.25 MG PO TABS
0.2500 mg | ORAL_TABLET | Freq: Every evening | ORAL | 0 refills | Status: DC | PRN
Start: 2021-05-22 — End: 2022-07-09

## 2021-05-22 NOTE — Telephone Encounter (Signed)
Name of Medication: Alprazolam Name of Pharmacy: [facility] Last Fill or Written Date and Quantity: 03/27/20, #30 Last Office Visit and Type: 08/06/20, CPE Next Office Visit and Type: none Last Controlled Substance Agreement Date: 03/28/14 Last UDS: 03/28/14

## 2021-05-22 NOTE — Telephone Encounter (Signed)
He flies out tomorrow   LAST APPOINTMENT DATE: Visit date not found   NEXT APPOINTMENT DATE:@Visit  date not found  MEDICATION: xanxax  PHARMACY: [facility] [address]  Let patient know to contact pharmacy at the end of the day to make sure medication is ready.  Please notify patient to allow 48-72 hours to process  Encourage patient to contact the pharmacy for refills or they can request refills through [place]:   LAST REFILL:  QTY:  REFILL DATE:    OTHER COMMENTS:    Okay for refill?  Please advise

## 2021-05-22 NOTE — Telephone Encounter (Signed)
ERx 

## 2021-08-11 ENCOUNTER — Other Ambulatory Visit: Payer: Self-pay | Admitting: Family Medicine

## 2021-08-19 ENCOUNTER — Other Ambulatory Visit: Payer: Self-pay | Admitting: Family Medicine

## 2021-08-20 NOTE — Telephone Encounter (Signed)
E-scribed refill.  Plz schedule cpe to prevent delays in future refills.

## 2021-10-28 ENCOUNTER — Other Ambulatory Visit: Payer: Self-pay | Admitting: Family Medicine

## 2021-10-28 DIAGNOSIS — E7800 Pure hypercholesterolemia, unspecified: Secondary | ICD-10-CM

## 2021-10-28 DIAGNOSIS — E538 Deficiency of other specified B group vitamins: Secondary | ICD-10-CM

## 2021-10-28 DIAGNOSIS — Z1159 Encounter for screening for other viral diseases: Secondary | ICD-10-CM

## 2021-10-28 DIAGNOSIS — R7303 Prediabetes: Secondary | ICD-10-CM

## 2021-10-29 ENCOUNTER — Other Ambulatory Visit: Payer: 59

## 2021-11-05 ENCOUNTER — Ambulatory Visit (INDEPENDENT_AMBULATORY_CARE_PROVIDER_SITE_OTHER): Payer: 59 | Admitting: Family Medicine

## 2021-11-05 ENCOUNTER — Encounter: Payer: Self-pay | Admitting: Family Medicine

## 2021-11-05 ENCOUNTER — Other Ambulatory Visit: Payer: Self-pay

## 2021-11-05 VITALS — BP 136/72 | HR 62 | Temp 98.0°F | Ht 65.5 in | Wt 218.4 lb

## 2021-11-05 DIAGNOSIS — E538 Deficiency of other specified B group vitamins: Secondary | ICD-10-CM

## 2021-11-05 DIAGNOSIS — Z0000 Encounter for general adult medical examination without abnormal findings: Secondary | ICD-10-CM | POA: Diagnosis not present

## 2021-11-05 DIAGNOSIS — Z1159 Encounter for screening for other viral diseases: Secondary | ICD-10-CM | POA: Diagnosis not present

## 2021-11-05 DIAGNOSIS — G4733 Obstructive sleep apnea (adult) (pediatric): Secondary | ICD-10-CM

## 2021-11-05 DIAGNOSIS — Z125 Encounter for screening for malignant neoplasm of prostate: Secondary | ICD-10-CM

## 2021-11-05 DIAGNOSIS — E7800 Pure hypercholesterolemia, unspecified: Secondary | ICD-10-CM

## 2021-11-05 DIAGNOSIS — F418 Other specified anxiety disorders: Secondary | ICD-10-CM

## 2021-11-05 DIAGNOSIS — R7303 Prediabetes: Secondary | ICD-10-CM

## 2021-11-05 DIAGNOSIS — F40243 Fear of flying: Secondary | ICD-10-CM

## 2021-11-05 DIAGNOSIS — E669 Obesity, unspecified: Secondary | ICD-10-CM

## 2021-11-05 DIAGNOSIS — Z9989 Dependence on other enabling machines and devices: Secondary | ICD-10-CM

## 2021-11-05 DIAGNOSIS — E66812 Obesity, class 2: Secondary | ICD-10-CM

## 2021-11-05 DIAGNOSIS — Z1211 Encounter for screening for malignant neoplasm of colon: Secondary | ICD-10-CM

## 2021-11-05 DIAGNOSIS — Z114 Encounter for screening for human immunodeficiency virus [HIV]: Secondary | ICD-10-CM

## 2021-11-05 MED ORDER — FLUTICASONE PROPIONATE 50 MCG/ACT NA SUSP
NASAL | 4 refills | Status: DC
Start: 2021-11-05 — End: 2022-12-18

## 2021-11-05 MED ORDER — SERTRALINE HCL 50 MG PO TABS: 50.0000 mg | ORAL_TABLET | Freq: Every day | ORAL | 4 refills | Status: DC

## 2021-11-05 NOTE — Assessment & Plan Note (Signed)
Update A1c ?

## 2021-11-05 NOTE — Assessment & Plan Note (Signed)
Update B12 on 1049mcg daily

## 2021-11-05 NOTE — Assessment & Plan Note (Signed)
Congratulated on noted 10 lb weight loss - encouraged continued healthy diet and lifestyle choices to affect sustainable weight loss.

## 2021-11-05 NOTE — Assessment & Plan Note (Addendum)
Predominant anxiety.  Overall very well controlled. Desires to continue current sertraline dose 50mg  daily.

## 2021-11-05 NOTE — Progress Notes (Signed)
Patient ID: Nicholas Saunders, male    DOB: 05-May-1971, 50 y.o.   MRN: [id number]  This visit was conducted in person.  BP 136/72   Pulse 62   Temp 98 F (36.7 C) (Temporal)   Ht 5' 5.5" (1.664 m)   Wt 218 lb 6 oz (99.1 kg)   SpO2 98%   BMI 35.79 kg/m    CC: CPE Subjective:   HPI: Nicholas Saunders is a 50 y.o. male presenting on 11/05/2021 for Annual Exam   10 lb weight loss- drinking more water, exercising regularly going to gym 3x/wk. Combination of weight training and cardio.   Preventative: Colon cancer screening - discussed. Would like iFOB.  Prostate cancer screening - no nocturia. No fmhx.  Lung cancer screening - not eligible  Flu shot - declines  COVID vaccine - declines  Td 2006, Tdap 03/2014.  Hep A/B 03/2014, 04/2014 Shingrix - discussed, to consider  Seat belt use discussed  Sunscreen use discussed.  No changing moles on skin.  Sleep - averaging 6-7 hrs/night Non smoker  Alcohol - 1-2 beer/wk  Dentist Q6 months Eye doctor - yearly   Caffeine: 3-4 caffeinated beverages  Married.  Lives with wife, 1 dog  No children  Edu: 12th grade  Occ: Theatre manager, works from home  Activity: walking dogs, softball  Diet: some water, fruits/vegetables some       Relevant past medical, surgical, family and social history reviewed and updated as indicated. Interim medical history since our last visit reviewed. Allergies and medications reviewed and updated. Outpatient Medications Prior to Visit  Medication Sig Dispense Refill   ALPRAZolam (XANAX) 0.25 MG tablet Take 1 tablet (0.25 mg total) by mouth at bedtime as needed. 30 tablet 0   Cholecalciferol (VITAMIN D3) 50 MCG (2000 UT) TABS Take 1 tablet by mouth daily.     Cyanocobalamin (B-12) 1000 MCG SUBL Place 1 tablet under the tongue daily.     fluticasone (FLONASE) 50 MCG/ACT nasal spray SPRAY 2 SPRAYS INTO EACH NOSTRIL EVERY DAY 48 mL 3   sertraline (ZOLOFT) 50 MG tablet TAKE 1 TABLET BY MOUTH EVERY DAY 90  tablet 0   Facility-Administered Medications Prior to Visit  Medication Dose Route Frequency Provider Last Rate Last Admin   methylPREDNISolone acetate (DEPO-MEDROL) injection 40 mg  40 mg Intramuscular Once McLean-Scocuzza, Nino Glow, MD         Per HPI unless specifically indicated in ROS section below Review of Systems  Constitutional:  Negative for activity change, appetite change, chills, fatigue, fever and unexpected weight change.  HENT:  Negative for hearing loss.        Recent cold  Eyes:  Negative for visual disturbance.  Respiratory:  Positive for cough (recent cold). Negative for chest tightness, shortness of breath and wheezing.   Cardiovascular:  Negative for chest pain, palpitations and leg swelling.  Gastrointestinal:  Negative for abdominal distention, abdominal pain, blood in stool, constipation, diarrhea, nausea and vomiting.  Genitourinary:  Negative for difficulty urinating and hematuria.  Musculoskeletal:  Negative for arthralgias, myalgias and neck pain.  Skin:  Negative for rash.  Neurological:  Negative for dizziness, seizures, syncope and headaches.  Hematological:  Negative for adenopathy. Does not bruise/bleed easily.  Psychiatric/Behavioral:  Negative for dysphoric mood. The patient is not nervous/anxious.    Objective:  BP 136/72   Pulse 62   Temp 98 F (36.7 C) (Temporal)   Ht 5' 5.5" (1.664 m)   Wt 218 lb 6 oz (  99.1 kg)   SpO2 98%   BMI 35.79 kg/m   Wt Readings from Last 3 Encounters:  11/05/21 218 lb 6 oz (99.1 kg)  08/06/20 228 lb 1 oz (103.4 kg)  05/07/20 224 lb 7 oz (101.8 kg)      Physical Exam Vitals and nursing note reviewed.  Constitutional:      General: He is not in acute distress.    Appearance: Normal appearance. He is well-developed. He is not ill-appearing.  HENT:     Head: Normocephalic and atraumatic.     Right Ear: Hearing, tympanic membrane, ear canal and external ear normal.     Left Ear: Hearing, tympanic membrane, ear  canal and external ear normal.  Eyes:     General: No scleral icterus.    Extraocular Movements: Extraocular movements intact.     Conjunctiva/sclera: Conjunctivae normal.     Pupils: Pupils are equal, round, and reactive to light.  Neck:     Thyroid: No thyroid mass or thyromegaly.  Cardiovascular:     Rate and Rhythm: Normal rate and regular rhythm.     Pulses: Normal pulses.          Radial pulses are 2+ on the right side and 2+ on the left side.     Heart sounds: Normal heart sounds. No murmur heard. Pulmonary:     Effort: Pulmonary effort is normal. No respiratory distress.     Breath sounds: Normal breath sounds. No wheezing, rhonchi or rales.  Abdominal:     General: Bowel sounds are normal. There is no distension.     Palpations: Abdomen is soft. There is no mass.     Tenderness: There is no abdominal tenderness. There is no guarding or rebound.     Hernia: No hernia is present.  Musculoskeletal:        General: Normal range of motion.     Cervical back: Normal range of motion and neck supple.     Right lower leg: No edema.     Left lower leg: No edema.  Lymphadenopathy:     Cervical: No cervical adenopathy.  Skin:    General: Skin is warm and dry.     Findings: No rash.  Neurological:     General: No focal deficit present.     Mental Status: He is alert and oriented to person, place, and time.  Psychiatric:        Mood and Affect: Mood normal.        Behavior: Behavior normal.        Thought Content: Thought content normal.        Judgment: Judgment normal.      Results for orders placed or performed in visit on 08/06/20  POCT glycosylated hemoglobin (Hb A1C)  Result Value Ref Range   Hemoglobin A1C 5.7 (A) 4.0 - 5.6 %   HbA1c POC (<> result, manual entry)     HbA1c, POC (prediabetic range)     HbA1c, POC (controlled diabetic range)      Assessment & Plan:  This visit occurred during the SARS-CoV-2 public health emergency.  Safety protocols were in place,  including screening questions prior to the visit, additional usage of staff PPE, and extensive cleaning of exam room while observing appropriate contact time as indicated for disinfecting solutions.   Problem List Items Addressed This Visit     Healthcare maintenance - Primary (Chronic)    Preventative protocols reviewed and updated unless pt declined. Discussed healthy diet and  lifestyle.       Phobia, flying    Rare xanax use.       Relevant Medications   sertraline (ZOLOFT) 50 MG tablet   Anxiety with depression    Predominant anxiety.  Overall very well controlled. Desires to continue current sertraline dose 8m daily.       Relevant Medications   sertraline (ZOLOFT) 50 MG tablet   Obesity, Class II, BMI 35-39.9, no comorbidity    Congratulated on noted 10 lb weight loss - encouraged continued healthy diet and lifestyle choices to affect sustainable weight loss.       OSA on CPAP    Continues CPAP use - 100% compliant according to app.  Last saw Kasa ~2018.       HLD (hyperlipidemia)    Chronic, stable off medications. Update FLP.       Low serum vitamin B12    Update B12 on 10062m daily       Prediabetes    Update A1c.       Other Visit Diagnoses     Need for hepatitis C screening test       Special screening for malignant neoplasms, colon       Relevant Orders   Fecal occult blood, imunochemical   Encounter for screening for HIV       Relevant Orders   HIV Antibody (routine testing w rflx)   Special screening for malignant neoplasm of prostate       Relevant Orders   PSA        Meds ordered this encounter  Medications   sertraline (ZOLOFT) 50 MG tablet    Sig: Take 1 tablet (50 mg total) by mouth daily.    Dispense:  90 tablet    Refill:  4   fluticasone (FLONASE) 50 MCG/ACT nasal spray    Sig: SPRAY 2 SPRAYS INTO EACH NOSTRIL EVERY DAY    Dispense:  48 mL    Refill:  4    Orders Placed This Encounter  Procedures   Fecal occult blood,  imunochemical    Standing Status:   Future    Standing Expiration Date:   11/05/2022   HIV Antibody (routine testing w rflx)   PSA    Patient instructions: Labs today Pass by lab for stool kit.  Consider shingrix vaccine series.  You are doing well today. Congrats on weight loss! Keep it up! Return as needed or in 1 year for next physical.   Follow up plan: Return in about 1 year (around 11/05/2022) for annual exam, prior fasting for blood work.  JaRia BushMD

## 2021-11-05 NOTE — Addendum Note (Signed)
Addended by: Ellamae Sia on: 11/05/2021 04:18 PM   Modules accepted: Orders

## 2021-11-05 NOTE — Assessment & Plan Note (Signed)
Rare xanax use.

## 2021-11-05 NOTE — Assessment & Plan Note (Signed)
Preventative protocols reviewed and updated unless pt declined. Discussed healthy diet and lifestyle.  

## 2021-11-05 NOTE — Assessment & Plan Note (Signed)
Chronic, stable off medications. Update FLP.

## 2021-11-05 NOTE — Patient Instructions (Addendum)
Labs today Pass by lab for stool kit.  Consider shingrix vaccine series.  You are doing well today. Congrats on weight loss! Keep it up! Return as needed or in 1 year for next physical.   Health Maintenance, Male Adopting a healthy lifestyle and getting preventive care are important in promoting health and wellness. Ask your health care provider about: The right schedule for you to have regular tests and exams. Things you can do on your own to prevent diseases and keep yourself healthy. What should I know about diet, weight, and exercise? Eat a healthy diet  Eat a diet that includes plenty of vegetables, fruits, low-fat dairy products, and lean protein. Do not eat a lot of foods that are high in solid fats, added sugars, or sodium. Maintain a healthy weight Body mass index (BMI) is a measurement that can be used to identify possible weight problems. It estimates body fat based on height and weight. Your health care provider can help determine your BMI and help you achieve or maintain a healthy weight. Get regular exercise Get regular exercise. This is one of the most important things you can do for your health. Most adults should: Exercise for at least 150 minutes each week. The exercise should increase your heart rate and make you sweat (moderate-intensity exercise). Do strengthening exercises at least twice a week. This is in addition to the moderate-intensity exercise. Spend less time sitting. Even light physical activity can be beneficial. Watch cholesterol and blood lipids Have your blood tested for lipids and cholesterol at 50 years of age, then have this test every 5 years. You may need to have your cholesterol levels checked more often if: Your lipid or cholesterol levels are high. You are older than 50 years of age. You are at high risk for heart disease. What should I know about cancer screening? Many types of cancers can be detected early and may often be prevented. Depending  on your health history and family history, you may need to have cancer screening at various ages. This may include screening for: Colorectal cancer. Prostate cancer. Skin cancer. Lung cancer. What should I know about heart disease, diabetes, and high blood pressure? Blood pressure and heart disease High blood pressure causes heart disease and increases the risk of stroke. This is more likely to develop in people who have high blood pressure readings or are overweight. Talk with your health care provider about your target blood pressure readings. Have your blood pressure checked: Every 3-5 years if you are 109-38 years of age. Every year if you are 39 years old or older. If you are between the ages of 60 and 56 and are a current or former smoker, ask your health care provider if you should have a one-time screening for abdominal aortic aneurysm (AAA). Diabetes Have regular diabetes screenings. This checks your fasting blood sugar level. Have the screening done: Once every three years after age 43 if you are at a normal weight and have a low risk for diabetes. More often and at a younger age if you are overweight or have a high risk for diabetes. What should I know about preventing infection? Hepatitis B If you have a higher risk for hepatitis B, you should be screened for this virus. Talk with your health care provider to find out if you are at risk for hepatitis B infection. Hepatitis C Blood testing is recommended for: Everyone born from [birth year] through 1965. Anyone with known risk factors for hepatitis C. Sexually  transmitted infections (STIs) You should be screened each year for STIs, including gonorrhea and chlamydia, if: You are sexually active and are younger than 50 years of age. You are older than 50 years of age and your health care provider tells you that you are at risk for this type of infection. Your sexual activity has changed since you were last screened, and you are at  increased risk for chlamydia or gonorrhea. Ask your health care provider if you are at risk. Ask your health care provider about whether you are at high risk for HIV. Your health care provider may recommend a prescription medicine to help prevent HIV infection. If you choose to take medicine to prevent HIV, you should first get tested for HIV. You should then be tested every 3 months for as long as you are taking the medicine. Follow these instructions at home: Alcohol use Do not drink alcohol if your health care provider tells you not to drink. If you drink alcohol: Limit how much you have to 0-2 drinks a day. Know how much alcohol is in your drink. In the [place] one drink equals one 12 oz bottle of beer (355 mL), one 5 oz glass of wine (148 mL), or one 1 oz glass of hard liquor (44 mL). Lifestyle Do not use any products that contain nicotine or tobacco. These products include cigarettes, chewing tobacco, and vaping devices, such as e-cigarettes. If you need help quitting, ask your health care provider. Do not use street drugs. Do not share needles. Ask your health care provider for help if you need support or information about quitting drugs. General instructions Schedule regular health, dental, and eye exams. Stay current with your vaccines. Tell your health care provider if: You often feel depressed. You have ever been abused or do not feel safe at home. Summary Adopting a healthy lifestyle and getting preventive care are important in promoting health and wellness. Follow your health care provider's instructions about healthy diet, exercising, and getting tested or screened for diseases. Follow your health care provider's instructions on monitoring your cholesterol and blood pressure. This information is not intended to replace advice given to you by your health care provider. Make sure you discuss any questions you have with your health care provider. Document Revised: 04/21/2021  Document Reviewed: 04/21/2021 Elsevier Patient Education  [place].

## 2021-11-05 NOTE — Assessment & Plan Note (Signed)
Continues CPAP use - 100% compliant according to app.  Last saw Kasa ~2018.

## 2021-11-07 LAB — HEMOGLOBIN A1C
Hgb A1c MFr Bld: 5.8 % of total Hgb — ABNORMAL HIGH (ref <5.7)
Mean Plasma Glucose: 120 mg/dL
eAG (mmol/L): 6.6 mmol/L

## 2021-11-07 LAB — COMPREHENSIVE METABOLIC PANEL
AG Ratio: 1.9 (calc) (ref 1.0–2.5)
ALT: 25 U/L (ref 9–46)
AST: 17 U/L (ref 10–35)
Albumin: 4.6 g/dL (ref 3.6–5.1)
Alkaline phosphatase (APISO): 63 U/L (ref 35–144)
BUN: 16 mg/dL (ref 7–25)
CO2: 27 mmol/L (ref 20–32)
Calcium: 9.3 mg/dL (ref 8.6–10.3)
Chloride: 103 mmol/L (ref 98–110)
Creat: 1.2 mg/dL (ref 0.70–1.30)
Globulin: 2.4 g/dL (calc) (ref 1.9–3.7)
Glucose, Bld: 91 mg/dL (ref 65–99)
Potassium: 4.1 mmol/L (ref 3.5–5.3)
Sodium: 141 mmol/L (ref 135–146)
Total Bilirubin: 0.6 mg/dL (ref 0.2–1.2)
Total Protein: 7 g/dL (ref 6.1–8.1)

## 2021-11-07 LAB — HEPATITIS C ANTIBODY
Hepatitis C Ab: NONREACTIVE
SIGNAL TO CUT-OFF: 0.04 (ref <1.00)

## 2021-11-07 LAB — LIPID PANEL
Cholesterol: 190 mg/dL (ref <200)
HDL: 41 mg/dL (ref >=40)
LDL Cholesterol (Calc): 124 mg/dL (calc) — ABNORMAL HIGH
Non-HDL Cholesterol (Calc): 149 mg/dL (calc) — ABNORMAL HIGH (ref <130)
Total CHOL/HDL Ratio: 4.6 (calc) (ref <5.0)
Triglycerides: 142 mg/dL (ref <150)

## 2021-11-07 LAB — HIV ANTIBODY (ROUTINE TESTING W REFLEX): HIV 1&2 Ab, 4th Generation: NONREACTIVE

## 2021-11-07 LAB — PSA: PSA: 0.73 ng/mL (ref <=4.00)

## 2021-11-07 LAB — VITAMIN B12: Vitamin B-12: 1536 pg/mL — ABNORMAL HIGH (ref 200–1100)

## 2021-11-11 ENCOUNTER — Other Ambulatory Visit: Payer: Self-pay | Admitting: Family Medicine

## 2021-11-11 MED ORDER — B-12 1000 MCG SL SUBL: 1.0000 | SUBLINGUAL_TABLET | SUBLINGUAL | Status: DC

## 2022-03-19 ENCOUNTER — Other Ambulatory Visit: Payer: Self-pay | Admitting: Family Medicine

## 2022-04-10 ENCOUNTER — Encounter: Payer: Self-pay | Admitting: Family Medicine

## 2022-04-10 ENCOUNTER — Ambulatory Visit (INDEPENDENT_AMBULATORY_CARE_PROVIDER_SITE_OTHER): Payer: 59 | Admitting: Nurse Practitioner

## 2022-04-10 ENCOUNTER — Encounter: Payer: Self-pay | Admitting: Nurse Practitioner

## 2022-04-10 VITALS — BP 134/82 | HR 78 | Temp 97.8°F | Resp 16 | Wt 222.1 lb

## 2022-04-10 DIAGNOSIS — L255 Unspecified contact dermatitis due to plants, except food: Secondary | ICD-10-CM

## 2022-04-10 MED ORDER — PREDNISONE 20 MG PO TABS: ORAL_TABLET | ORAL | 0 refills | Status: AC

## 2022-04-10 NOTE — Progress Notes (Signed)
**Note Nicholas-Identified via Obfuscation** ? ?  Acute Office Visit ? ?Subjective:  ? ?  ?Patient ID: Nicholas Saunders, male    DOB: May 17, 1971, 51 y.o.   MRN: [id number] ? ?Chief Complaint  ?Patient presents with  ? Rash  ?  C/o rash on bilateral lower legs. Area is red and itchy.  Started about 3 wks ago. Tried OTC Cortizone-10 cream, not helpful.   ? ? ?Rash ?Pertinent negatives include no fever.  ?Patient is in today for  ?Symptoms started around 3 weeks ago after he started some brush. Does not live alone and no one else has it ?States he has tried anti itch spray and Cortizone cream and essential oils.  ?Rash is itching and he has scratched it hard enough to get it to bleed ? ?Review of Systems  ?Constitutional:  Negative for chills and fever.  ?Skin:  Positive for itching and rash.  ? ? ?   ?Objective:  ?  ?BP 134/82   Pulse 78   Temp 97.8 ?F (36.6 ?C)   Resp 16   Wt 222 lb 2 oz (100.8 kg)   SpO2 98%   BMI 36.40 kg/m?  ? ? ?Physical Exam ?Cardiovascular:  ?   Rate and Rhythm: Normal rate and regular rhythm.  ?   Heart sounds: Normal heart sounds.  ?Pulmonary:  ?   Breath sounds: Normal breath sounds.  ?Skin: ?   General: Skin is warm.  ?   Findings: Erythema and rash present.  ? ?    ?   Comments: See clinical photo  ? ? ? ? ?No results found for any visits on 04/10/22. ? ? ?   ?Assessment & Plan:  ? ?Problem List Items Addressed This Visit   ? ?  ? Musculoskeletal and Integument  ? Rhus dermatitis - Primary  ?  Has been using OTC treatments without relief. Happened after he worked outside pulling brush. Will start prednisone taper over 8 days. Told patinet he can use otc antihistamine along with OTC creams if they help. ?He can take '40mg'$  of the prednisone as soon as he gets the prescription and then as prescribed there after ? ?  ?  ? Relevant Medications  ? predniSONE (DELTASONE) 20 MG tablet  ? ? ?No orders of the defined types were placed in this encounter. ? ? ?No follow-ups on file. ? ?Romilda Garret, NP ? ? ?

## 2022-04-10 NOTE — Patient Instructions (Signed)
Nice to see you today ?Avoid Nsaids while on the prednisone ?If it does not improve or gets worse let me know ? ?

## 2022-04-10 NOTE — Telephone Encounter (Signed)
Spoke with pt scheduling OV today with Matt at 2:20. ?

## 2022-04-10 NOTE — Assessment & Plan Note (Addendum)
Has been using OTC treatments without relief. Happened after he worked outside pulling brush. Will start prednisone taper over 8 days. Told patinet he can use otc antihistamine along with OTC creams if they help. ?He can take '40mg'$  of the prednisone as soon as he gets the prescription and then as prescribed there after ?

## 2022-05-04 ENCOUNTER — Telehealth: Payer: Self-pay | Admitting: Internal Medicine

## 2022-05-04 ENCOUNTER — Telehealth: Payer: Self-pay | Admitting: Family Medicine

## 2022-05-04 ENCOUNTER — Encounter: Payer: Self-pay | Admitting: Family Medicine

## 2022-05-04 DIAGNOSIS — G4733 Obstructive sleep apnea (adult) (pediatric): Secondary | ICD-10-CM

## 2022-05-04 NOTE — Telephone Encounter (Signed)
Spoke with pt about request.  States when he spoke with Dr. Zoila Shutter office, they referred pt to Dr. Darnell Level because he hasn't been seen there in 3 yrs.

## 2022-05-04 NOTE — Telephone Encounter (Signed)
Rx written and in Lisa's box. Plz fax to desired company out of town to rent CPAP machine x 1 wk.

## 2022-05-04 NOTE — Telephone Encounter (Signed)
Patient's cpap is broken and he needs a rx sent in for a new one to [facility]. He is out of town and also needs a copy of the RX to be able to rent one.

## 2022-05-04 NOTE — Telephone Encounter (Signed)
See 05/04/22 Pt Msg.

## 2022-05-04 NOTE — Telephone Encounter (Signed)
Spoke to patient.  He is requesting order for an for cpap machine. Patient last seen in 2018. He is aware that an appt is needed prior to order. I offered an appt. He stated that he is currently out of town. He will contact PCP to see if he will place an order for new cpap. He will call back to schedule OV if needed.

## 2022-05-05 NOTE — Telephone Encounter (Addendum)
Faxed rx to fax # [phone] in [place], [place] for rental CPAP.  Per Dr. Darnell Level, pt will need to see pulmonology for replacement CPAP.

## 2022-05-05 NOTE — Telephone Encounter (Signed)
Faxed rx to fax # [phone] in [place], [place] for rental CPAP.  (See 05/04/22 Pt Msg.)

## 2022-05-06 NOTE — Telephone Encounter (Signed)
Filled and in Lisa's box 

## 2022-05-06 NOTE — Telephone Encounter (Signed)
Received DWO from [facility] for CPAP.  Placed form in Dr. Synthia Innocent box.

## 2022-05-07 NOTE — Telephone Encounter (Signed)
Faxed form to [facility] at [phone].

## 2022-05-08 NOTE — Addendum Note (Signed)
Addended by: Ria Bush on: 05/08/2022 05:47 PM   Modules accepted: Orders

## 2022-05-08 NOTE — Telephone Encounter (Addendum)
New CPAP Rx written and placed in Nicholas Saunders's box.  Plz check to see where pt wants this sent now that he's back home locally.  We could send to his normal DME supplier until he gets in with pulm - that way he doesn't have to rent another machine.

## 2022-05-12 NOTE — Telephone Encounter (Signed)
Faxed written order to [facility].

## 2022-05-18 ENCOUNTER — Encounter: Payer: Self-pay | Admitting: Primary Care

## 2022-05-18 ENCOUNTER — Ambulatory Visit (INDEPENDENT_AMBULATORY_CARE_PROVIDER_SITE_OTHER): Payer: 59 | Admitting: Primary Care

## 2022-05-18 VITALS — BP 126/70 | HR 74 | Temp 97.9°F | Ht 66.0 in | Wt 216.8 lb

## 2022-05-18 DIAGNOSIS — G4733 Obstructive sleep apnea (adult) (pediatric): Secondary | ICD-10-CM

## 2022-05-18 DIAGNOSIS — R0683 Snoring: Secondary | ICD-10-CM

## 2022-05-18 DIAGNOSIS — Z9989 Dependence on other enabling machines and devices: Secondary | ICD-10-CM | POA: Diagnosis not present

## 2022-05-18 NOTE — Assessment & Plan Note (Signed)
-   Patient has a history of mild obstructive sleep apnea, diagnosed in 2017 at [facility] sleep [facility].  He is 93% compliant with use.  Average usage 6 hours 24 minutes.  Current pressure settings 10 cm H2O with residual AHI 0.1.  No pressure changes needed today.  He needs an order for new CPAP machine through [facility].  Encourage patient continue to work on weight loss efforts.  Advised against driving if experiencing excessive daytime sleepiness or fatigue.  Follow-up in 1 year or sooner if needed.

## 2022-05-18 NOTE — Patient Instructions (Addendum)
Recommendations: Focus on side sleeping position Work on weight loss efforts if needed  Do not drive if experiencing excessive daytime sleepiness of fatigue  Our office in the next several weeks if you have difficulty getting a new CPAP or run into any issues  Orders: New CPAP machine at 10 cm H2O (DME company is [employer])    Follow-up: 1 year with Beth NP for OSA compliance check   Sleep Apnea Sleep apnea affects breathing during sleep. It causes breathing to stop for 10 seconds or more, or to become shallow. People with sleep apnea usually snore loudly. It can also increase the risk of: Heart attack. Stroke. Being very overweight (obese). Diabetes. Heart failure. Irregular heartbeat. High blood pressure. The goal of treatment is to help you breathe normally again. What are the causes?  The most common cause of this condition is a collapsed or blocked airway. There are three kinds of sleep apnea: Obstructive sleep apnea. This is caused by a blocked or collapsed airway. Central sleep apnea. This happens when the brain does not send the right signals to the muscles that control breathing. Mixed sleep apnea. This is a combination of obstructive and central sleep apnea. What increases the risk? Being overweight. Smoking. Having a small airway. Being older. Being male. Drinking alcohol. Taking medicines to calm yourself (sedatives or tranquilizers). Having family members with the condition. Having a tongue or tonsils that are larger than normal. What are the signs or symptoms? Trouble staying asleep. Loud snoring. Headaches in the morning. Waking up gasping. Dry mouth or sore throat in the morning. Being sleepy or tired during the day. If you are sleepy or tired during the day, you may also: Not be able to focus your mind (concentrate). Forget things. Get angry a lot and have mood swings. Feel sad (depressed). Have changes in your personality. Have less interest  in sex, if you are male. Be unable to have an erection, if you are male. How is this treated?  Sleeping on your side. Using a medicine to get rid of mucus in your nose (decongestant). Avoiding the use of alcohol, medicines to help you relax, or certain pain medicines (narcotics). Losing weight, if needed. Changing your diet. Quitting smoking. Using a machine to open your airway while you sleep, such as: An oral appliance. This is a mouthpiece that shifts your lower jaw forward. A CPAP device. This device blows air through a mask when you breathe out (exhale). An EPAP device. This has valves that you put in each nostril. A BIPAP device. This device blows air through a mask when you breathe in (inhale) and breathe out. Having surgery if other treatments do not work. Follow these instructions at home: Lifestyle Make changes that your doctor recommends. Eat a healthy diet. Lose weight if needed. Avoid alcohol, medicines to help you relax, and some pain medicines. Do not smoke or use any products that contain nicotine or tobacco. If you need help quitting, ask your doctor. General instructions Take over-the-counter and prescription medicines only as told by your doctor. If you were given a machine to use while you sleep, use it only as told by your doctor. If you are having surgery, make sure to tell your doctor you have sleep apnea. You may need to bring your device with you. Keep all follow-up visits. Contact a doctor if: The machine that you were given to use during sleep bothers you or does not seem to be working. You do not get  better. You get worse. Get help right away if: Your chest hurts. You have trouble breathing in enough air. You have an uncomfortable feeling in your back, arms, or stomach. You have trouble talking. One side of your body feels weak. A part of your face is hanging down. These symptoms may be an emergency. Get help right away. Call your local emergency  services (911 in the [place] Do not wait to see if the symptoms will go away. Do not drive yourself to the hospital. Summary This condition affects breathing during sleep. The most common cause is a collapsed or blocked airway. The goal of treatment is to help you breathe normally while you sleep. This information is not intended to replace advice given to you by your health care provider. Make sure you discuss any questions you have with your health care provider. Document Revised: 07/09/2021 Document Reviewed: 11/08/2020 Elsevier Patient Education  [employer].

## 2022-05-18 NOTE — Progress Notes (Signed)
$'@Patient'F$  ID: Nicholas Saunders, male    DOB: 08/08/1971, 51 y.o.   MRN: [id number]  Chief Complaint  Patient presents with   Consult    Referring provider: Ria Bush, MD  HPI: 51 year old male, never smoked.  Past medical history significant for OSA on CPAP, GERD, allergic rhinitis, hyperlipidemia, diabetes, obesity.  05/18/2022 Patient presents today for sleep consult.  Patient had a sleep study with [facility] back in 2017.  He is currently maintained on CPAP at a pressure of 10 cm H2O. No issues with CPAP or pressure setting. Sleeping well at night. No significant daytime sleepiness.  He reports significant improvement in snoring since starting on PAP therapy.  Needs order for new CPAP machine. His current one broke 2 weeks ago when he was in [place] . He is renting CPAP machine through his DME company [employer]. Epworth 6  ResMed Airview download 04/02/2022 - 05/01/2022 Usage days 29/30 days (97%); 28 days (93 percent) greater than 4 hours Average usage 6 hours 24 minutes Pressure 10 cm H2O Air leaks 1.9 L/min (95%) AHI 0.1/hr  Sleep questionnaire Symptoms-mild sleep apnea Prior sleep study-Yes; 2017-2018 [place] to 11 PM Time to fall asleep-15 to 30 minutes Nocturnal awakenings-0 Out of bed/start of day-6:45 to 7 AM Weight changes-N/A Do you operate heavy machinery-no Do you currently wear CPAP- yes, 10cm Do you current wear oxygen- no Epworth- 6  Allergies  Allergen Reactions   Citalopram Hydrobromide Other (See Comments)    tachycardia    Immunization History  Administered Date(s) Administered   Hep A / Hep B 03/23/2014, 04/24/2014   Td 07/16/2005   Tdap 03/22/2014    Past Medical History:  Diagnosis Date   Anxiety    plane rides   H/O: Bell's palsy 2004   H/O: pneumonia age [age]   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 30 tablet 0   Cholecalciferol (VITAMIN D3) 50 MCG (2000 UT) TABS Take 1 tablet by mouth daily.     Coenzyme Q10 (COQ-10) 100 MG CAPS      Cyanocobalamin (B-12) 1000 MCG SUBL Place 1 tablet under the tongue every Monday, Wednesday, and Friday. 30 tablet    fluticasone (FLONASE) 50 MCG/ACT nasal spray SPRAY 2 SPRAYS INTO EACH NOSTRIL EVERY DAY 48 mL 4   levOCARNitine (L-CARNITINE) 500 MG TABS      Multiple Vitamins-Minerals (MULTIVITAMIN ADULTS 50+) TABS      sertraline (ZOLOFT) 50 MG tablet TAKE 1 TABLET BY MOUTH EVERY DAY 90 tablet 2   Facility-Administered Medications Prior to Visit  Medication Dose Route Frequency Provider Last Rate Last Admin   methylPREDNISolone acetate (DEPO-MEDROL) injection 40 mg  40 mg Intramuscular Once McLean-Scocuzza, Nino Glow, MD       Review of Systems  Review of Systems  Constitutional: Negative.   HENT: Negative.    Respiratory: Negative.    Cardiovascular: Negative.   Psychiatric/Behavioral: Negative.  Physical Exam  BP 126/70 (BP Location: Left Arm, Patient Position: Sitting, Cuff Size: Normal)   Pulse 74   Temp 97.9 F (36.6 C) (Oral)   Ht '5\' 6"'$  (1.676 m)   Wt 216 lb 12.8 oz (98.3 kg)   SpO2 99%   BMI 34.99 kg/m  Physical Exam Constitutional:      Appearance: Normal appearance.  HENT:     Mouth/Throat:     Mouth: Mucous membranes are moist.     Pharynx: Oropharynx is clear.  Cardiovascular:     Rate and Rhythm: Normal rate and regular rhythm.  Pulmonary:     Effort: Pulmonary effort is normal.     Breath sounds: Normal  breath sounds.  Musculoskeletal:        General: Normal range of motion.  Skin:    General: Skin is warm and dry.  Neurological:     General: No focal deficit present.     Mental Status: He is alert and oriented to person, place, and time. Mental status is at baseline.  Psychiatric:        Mood and Affect: Mood normal.        Behavior: Behavior normal.        Thought Content: Thought content normal.        Judgment: Judgment normal.     Lab Results:  CBC    Component Value Date/Time   WBC 4.2 05/17/2020 0817   RBC 4.79 05/17/2020 0817   HGB 15.4 05/17/2020 0817   HCT 43.4 05/17/2020 0817   PLT 215.0 05/17/2020 0817   MCV 90.5 05/17/2020 0817   MCHC 35.4 05/17/2020 0817   RDW 13.9 05/17/2020 0817   LYMPHSABS 1.5 05/17/2020 0817   MONOABS 0.4 05/17/2020 0817   EOSABS 0.1 05/17/2020 0817   BASOSABS 0.0 05/17/2020 0817    BMET    Component Value Date/Time   NA 141 11/05/2021 1616   K 4.1 11/05/2021 1616   CL 103 11/05/2021 1616   CO2 27 11/05/2021 1616   GLUCOSE 91 11/05/2021 1616   BUN 16 11/05/2021 1616   CREATININE 1.20 11/05/2021 1616   CALCIUM 9.3 11/05/2021 1616    BNP No results found for: BNP  ProBNP No results found for: PROBNP  Imaging: No results found.   Assessment & Plan:   OSA on CPAP - Patient has a history of mild obstructive sleep apnea, diagnosed in 2017 at [facility].  He is 93% compliant with use.  Average usage 6 hours 24 minutes.  Current pressure settings 10 cm H2O with residual AHI 0.1.  No pressure changes needed today.  He needs an order for new CPAP machine through [employer].  Encourage patient continue to work on weight loss efforts.  Advised against driving if experiencing excessive daytime sleepiness or fatigue.  Follow-up in 1 year or sooner if needed.   Martyn Ehrich, NP 05/18/2022

## 2022-07-09 ENCOUNTER — Other Ambulatory Visit: Payer: Self-pay | Admitting: Family Medicine

## 2022-07-09 NOTE — Telephone Encounter (Signed)
Following attached note from pt:  Patient comment: Need refill for upcoming flights. I am not able to change the pharmacy, so [id number] [place] [phone] Name of Medication: Alprazolam Name of [facility]acy: [facility] Last FVenida Jarvisor Written Date and Quantity: 05/22/21, #30 Last Office Visit and Type: 11/05/21, CPE Next Office Visit and Type: none Last Controlled Substance Agreement Date: 03/28/14 Last UDS: 03/28/14

## 2022-07-10 MED ORDER — ALPRAZOLAM 0.25 MG PO TABS: 0.2500 mg | ORAL_TABLET | Freq: Every evening | ORAL | 0 refills | Status: DC | PRN

## 2022-07-10 NOTE — Telephone Encounter (Signed)
ERx 

## 2022-07-27 ENCOUNTER — Institutional Professional Consult (permissible substitution): Payer: 59 | Admitting: Primary Care

## 2022-10-21 ENCOUNTER — Encounter: Payer: Self-pay | Admitting: Family Medicine

## 2022-10-22 ENCOUNTER — Ambulatory Visit (INDEPENDENT_AMBULATORY_CARE_PROVIDER_SITE_OTHER): Payer: 59 | Admitting: Nurse Practitioner

## 2022-10-22 VITALS — BP 130/80 | HR 63 | Temp 97.2°F | Resp 14 | Ht 66.0 in | Wt 220.2 lb

## 2022-10-22 DIAGNOSIS — H6501 Acute serous otitis media, right ear: Secondary | ICD-10-CM

## 2022-10-22 MED ORDER — AMOXICILLIN-POT CLAVULANATE 875-125 MG PO TABS: 1.0000 | ORAL_TABLET | Freq: Two times a day (BID) | ORAL | 0 refills | Status: AC

## 2022-10-22 NOTE — Assessment & Plan Note (Signed)
We will treat with Augmentin 875-125 1 tablet twice daily for 7 days.  Precautions reviewed in regards to tympanic membrane rupture.  Patient's hearing does not return to normal he will reach out we can try a course of high-dose steroids.

## 2022-10-22 NOTE — Patient Instructions (Signed)
Nice to see you today If you hearing does not start coming back with the antibiotic let me know Follow up if no improvement or if symptoms worsen

## 2022-10-22 NOTE — Progress Notes (Signed)
Acute Office Visit  Subjective:     Patient ID: Nicholas Saunders, male    DOB: 1971-07-11, 51 y.o.   MRN: [id number]  Chief Complaint  Patient presents with   Ear Fullness    Right ear feels stopped up and hurts off and on, in the last 3 to 4 days. Started coughing today. Some post nasal drip     Patient is in today for Ear pain   States that he noticed it approx 3-4 days ago. States it feel stopped up and feels full. States he was using ear drops in that has not helped. States no Q tips in the past week. States that he took theraflu yesterday for his chest congestion and cough   No sick contacts. States that his daughter was sick and they hung out over the weekend   No Covid vaccine No Covid test   Review of Systems  Constitutional:  Negative for chills, fever and malaise/fatigue.  HENT:  Positive for ear pain and sinus pain. Negative for sore throat.   Respiratory:  Positive for cough. Negative for sputum production and shortness of breath.   Musculoskeletal:  Negative for joint pain and myalgias.  Neurological:  Negative for headaches.        Objective:    BP 130/80   Pulse 63   Temp (!) 97.2 F (36.2 C) (Temporal)   Resp 14   Ht '5\' 6"'$  (1.676 m)   Wt 220 lb 4 oz (99.9 kg)   SpO2 96%   BMI 35.55 kg/m    Physical Exam Vitals and nursing note reviewed.  Constitutional:      Appearance: Normal appearance. He is obese.  HENT:     Right Ear: Ear canal and external ear normal. No tenderness. Tympanic membrane is erythematous and bulging.     Left Ear: Tympanic membrane, ear canal and external ear normal.     Mouth/Throat:     Mouth: Mucous membranes are moist.     Pharynx: Oropharynx is clear.  Cardiovascular:     Rate and Rhythm: Normal rate and regular rhythm.     Heart sounds: Normal heart sounds.  Pulmonary:     Effort: Pulmonary effort is normal.     Breath sounds: Normal breath sounds.  Lymphadenopathy:     Cervical: No cervical adenopathy.   Neurological:     Mental Status: He is alert.     No results found for any visits on 10/22/22.      Assessment & Plan:   Problem List Items Addressed This Visit       Nervous and Auditory   Non-recurrent acute serous otitis media of right ear - Primary    We will treat with Augmentin 875-125 1 tablet twice daily for 7 days.  Precautions reviewed in regards to tympanic membrane rupture.  Patient's hearing does not return to normal he will reach out we can try a course of high-dose steroids.      Relevant Medications   amoxicillin-clavulanate (AUGMENTIN) 875-125 MG tablet    Meds ordered this encounter  Medications   amoxicillin-clavulanate (AUGMENTIN) 875-125 MG tablet    Sig: Take 1 tablet by mouth 2 (two) times daily for 7 days.    Dispense:  14 tablet    Refill:  0    Order Specific Question:   Supervising Provider    Answer:   TOWER, MARNE A [[id number]    Return if symptoms worsen or fail to improve.  Romilda Garret, NP

## 2022-12-17 ENCOUNTER — Other Ambulatory Visit: Payer: Self-pay | Admitting: Family Medicine

## 2022-12-17 DIAGNOSIS — J309 Allergic rhinitis, unspecified: Secondary | ICD-10-CM

## 2022-12-18 NOTE — Telephone Encounter (Signed)
E-scribed refill.  Plz schedule CPE and lab visits for additional refills.  

## 2022-12-21 NOTE — Telephone Encounter (Signed)
Noted  

## 2022-12-21 NOTE — Telephone Encounter (Signed)
LVM for patient to call back and schedule

## 2023-02-23 ENCOUNTER — Encounter: Payer: 59 | Admitting: Family Medicine

## 2023-03-06 ENCOUNTER — Other Ambulatory Visit: Payer: Self-pay | Admitting: Family Medicine

## 2023-03-06 DIAGNOSIS — J309 Allergic rhinitis, unspecified: Secondary | ICD-10-CM

## 2023-04-02 ENCOUNTER — Other Ambulatory Visit: Payer: Self-pay | Admitting: Family Medicine

## 2023-04-02 ENCOUNTER — Encounter: Payer: Self-pay | Admitting: Family Medicine

## 2023-04-02 DIAGNOSIS — F418 Other specified anxiety disorders: Secondary | ICD-10-CM

## 2023-04-02 DIAGNOSIS — J309 Allergic rhinitis, unspecified: Secondary | ICD-10-CM

## 2023-05-04 ENCOUNTER — Encounter: Payer: Self-pay | Admitting: Family Medicine

## 2023-05-04 ENCOUNTER — Ambulatory Visit (INDEPENDENT_AMBULATORY_CARE_PROVIDER_SITE_OTHER): Payer: 59 | Admitting: Family Medicine

## 2023-05-04 VITALS — BP 130/78 | HR 75 | Temp 98.7°F | Ht 66.0 in | Wt 225.0 lb

## 2023-05-04 DIAGNOSIS — Z0000 Encounter for general adult medical examination without abnormal findings: Secondary | ICD-10-CM | POA: Diagnosis not present

## 2023-05-04 DIAGNOSIS — J309 Allergic rhinitis, unspecified: Secondary | ICD-10-CM

## 2023-05-04 DIAGNOSIS — R7303 Prediabetes: Secondary | ICD-10-CM

## 2023-05-04 DIAGNOSIS — E538 Deficiency of other specified B group vitamins: Secondary | ICD-10-CM

## 2023-05-04 DIAGNOSIS — E7800 Pure hypercholesterolemia, unspecified: Secondary | ICD-10-CM

## 2023-05-04 DIAGNOSIS — G4733 Obstructive sleep apnea (adult) (pediatric): Secondary | ICD-10-CM

## 2023-05-04 DIAGNOSIS — Z1211 Encounter for screening for malignant neoplasm of colon: Secondary | ICD-10-CM

## 2023-05-04 DIAGNOSIS — E66812 Obesity, class 2: Secondary | ICD-10-CM

## 2023-05-04 DIAGNOSIS — F418 Other specified anxiety disorders: Secondary | ICD-10-CM

## 2023-05-04 DIAGNOSIS — E669 Obesity, unspecified: Secondary | ICD-10-CM

## 2023-05-04 DIAGNOSIS — Z125 Encounter for screening for malignant neoplasm of prostate: Secondary | ICD-10-CM

## 2023-05-04 MED ORDER — FLUTICASONE PROPIONATE 50 MCG/ACT NA SUSP: 2.0000 | Freq: Every day | NASAL | 3 refills | Status: DC

## 2023-05-04 MED ORDER — SERTRALINE HCL 50 MG PO TABS: 50.0000 mg | ORAL_TABLET | Freq: Every day | ORAL | 4 refills | Status: DC

## 2023-05-04 NOTE — Assessment & Plan Note (Signed)
Update levels off b12 replacement.

## 2023-05-04 NOTE — Patient Instructions (Addendum)
Check at home on records for hepatitis B shot.  We will refer you for colonoscopy Good to see you today  Return as needed or in 1 year for next physical.

## 2023-05-04 NOTE — Assessment & Plan Note (Signed)
Continue flonase 

## 2023-05-04 NOTE — Assessment & Plan Note (Signed)
Encouraged healthy diet and lifestyle choices to affect sustainable weight loss. Recommend he restart walking routine as well as decrease portion sizes. Discussed GLP1RA injectable weight loss medications - if interested, he should check with employer to see if the cover these.

## 2023-05-04 NOTE — Assessment & Plan Note (Addendum)
Preventative protocols reviewed and updated unless pt declined. Discussed healthy diet and lifestyle.  He agrees to colonoscopy referral.

## 2023-05-04 NOTE — Assessment & Plan Note (Signed)
Update A1c ?

## 2023-05-04 NOTE — Assessment & Plan Note (Signed)
Update FLP off medication. The 10-year ASCVD risk score (Arnett DK, et al., 2019) is: 4.5%   Values used to calculate the score:     Age: 52 years     Sex: Male     Is Non-Hispanic African American: No     Diabetic: No     Tobacco smoker: No     Systolic Blood Pressure: 130 mmHg     Is BP treated: No     HDL Cholesterol: 41 mg/dL     Total Cholesterol: 190 mg/dL

## 2023-05-04 NOTE — Assessment & Plan Note (Signed)
Chronic, stable period on sertraline 50mg  daily. Continue.

## 2023-05-04 NOTE — Progress Notes (Signed)
Ph: [phone] Fax: [phone]   Patient ID: Nicholas Saunders, male    DOB: 10-Apr-1971, 52 y.o.   MRN: [id number]  This visit was conducted in person.  BP 130/78   Pulse 75   Temp 98.7 F (37.1 C) (Temporal)   Ht 5\' 6"  (1.676 m)   Wt 225 lb (102.1 kg)   SpO2 95%   BMI 36.32 kg/m    CC: CPE Subjective:   HPI: Nicholas Saunders is a 52 y.o. male presenting on 05/04/2023 for Annual Exam   Preventative: Colon cancer screening - discussed. Will refer for colonoscopy.  Prostate cancer screening - no nocturia. No fmhx.  Lung cancer screening - not eligible  Flu shot - declines  COVID vaccine - declines  Td 2006, Tdap 03/2014  Hep A/B 03/2014, 04/2014  Shingrix - discussed, to consider  Seat belt use discussed  Sunscreen use discussed.  No changing moles on skin.  Sleep - averaging 6-7 hrs/night Non smoker  Alcohol - occ Dentist Q6 months Eye doctor - yearly   Caffeine: 2 cups coffee  Married.  Lives with wife, 1 dog  Daughter  Edu: 12th grade  Occ: Marine scientist, works from home  Activity: walking dogs some  Diet: some water, some fruits/vegetables      Relevant past medical, surgical, family and social history reviewed and updated as indicated. Interim medical history since our last visit reviewed. Allergies and medications reviewed and updated. Outpatient Medications Prior to Visit  Medication Sig Dispense Refill   ALPRAZolam (XANAX) 0.25 MG tablet Take 1 tablet (0.25 mg total) by mouth at bedtime as needed. 30 tablet 0   Multiple Vitamins-Minerals (MULTIVITAMIN ADULTS 50+) TABS      fluticasone (FLONASE) 50 MCG/ACT nasal spray SPRAY 2 SPRAYS INTO EACH NOSTRIL EVERY DAY 48 mL 0   sertraline (ZOLOFT) 50 MG tablet TAKE 1 TABLET BY MOUTH EVERY DAY 90 tablet 0   Cholecalciferol (VITAMIN D3) 50 MCG (2000 UT) TABS Take 1 tablet by mouth daily.     Coenzyme Q10 (COQ-10) 100 MG CAPS  (Patient not taking: Reported on 05/04/2023)     Cyanocobalamin (B-12) 1000 MCG  SUBL Place 1 tablet under the tongue every Monday, Wednesday, and Friday. 30 tablet    levOCARNitine (L-CARNITINE) 500 MG TABS      methylPREDNISolone acetate (DEPO-MEDROL) injection 40 mg      No facility-administered medications prior to visit.     Per HPI unless specifically indicated in ROS section below Review of Systems  Constitutional:  Negative for activity change, appetite change, chills, fatigue, fever and unexpected weight change.  HENT:  Negative for hearing loss.   Eyes:  Negative for visual disturbance.  Respiratory:  Negative for cough, chest tightness, shortness of breath and wheezing.   Cardiovascular:  Negative for chest pain, palpitations and leg swelling.  Gastrointestinal:  Negative for abdominal distention, abdominal pain, blood in stool, constipation, diarrhea, nausea and vomiting.  Genitourinary:  Negative for difficulty urinating and hematuria.  Musculoskeletal:  Negative for arthralgias, myalgias and neck pain.  Skin:  Negative for rash.  Neurological:  Negative for dizziness, seizures, syncope and headaches.  Hematological:  Negative for adenopathy. Does not bruise/bleed easily.  Psychiatric/Behavioral:  Negative for dysphoric mood. The patient is not nervous/anxious.     Objective:  BP 130/78   Pulse 75   Temp 98.7 F (37.1 C) (Temporal)   Ht 5\' 6"  (1.676 m)   Wt 225 lb (102.1 kg)   SpO2 95%  BMI 36.32 kg/m   Wt Readings from Last 3 Encounters:  05/04/23 225 lb (102.1 kg)  10/22/22 220 lb 4 oz (99.9 kg)  05/18/22 216 lb 12.8 oz (98.3 kg)      Physical Exam Vitals and nursing note reviewed.  Constitutional:      General: He is not in acute distress.    Appearance: Normal appearance. He is well-developed. He is not ill-appearing.  HENT:     Head: Normocephalic and atraumatic.     Right Ear: Hearing, tympanic membrane, ear canal and external ear normal.     Left Ear: Hearing, tympanic membrane, ear canal and external ear normal.     Nose: Nose  normal.     Mouth/Throat:     Mouth: Mucous membranes are moist.     Pharynx: Oropharynx is clear. No oropharyngeal exudate or posterior oropharyngeal erythema.  Eyes:     General: No scleral icterus.    Extraocular Movements: Extraocular movements intact.     Conjunctiva/sclera: Conjunctivae normal.     Pupils: Pupils are equal, round, and reactive to light.  Neck:     Thyroid: No thyroid mass or thyromegaly.  Cardiovascular:     Rate and Rhythm: Normal rate and regular rhythm.     Pulses: Normal pulses.          Radial pulses are 2+ on the right side and 2+ on the left side.     Heart sounds: Normal heart sounds. No murmur heard. Pulmonary:     Effort: Pulmonary effort is normal. No respiratory distress.     Breath sounds: Normal breath sounds. No wheezing, rhonchi or rales.  Abdominal:     General: Bowel sounds are normal. There is no distension.     Palpations: Abdomen is soft. There is no mass.     Tenderness: There is no abdominal tenderness. There is no guarding or rebound.     Hernia: No hernia is present.  Musculoskeletal:        General: Normal range of motion.     Cervical back: Normal range of motion and neck supple.     Right lower leg: No edema.     Left lower leg: No edema.  Lymphadenopathy:     Cervical: No cervical adenopathy.  Skin:    General: Skin is warm and dry.     Findings: No rash.  Neurological:     General: No focal deficit present.     Mental Status: He is alert and oriented to person, place, and time.  Psychiatric:        Mood and Affect: Mood normal.        Behavior: Behavior normal.        Thought Content: Thought content normal.        Judgment: Judgment normal.       Results for orders placed or performed in visit on 11/05/21  PSA  Result Value Ref Range   PSA 0.73 < OR = 4.00 ng/mL  Hepatitis C antibody  Result Value Ref Range   Hepatitis C Ab NON-REACTIVE NON-REACTIVE   SIGNAL TO CUT-OFF 0.04 <1.00  HIV Antibody (routine testing  w rflx)  Result Value Ref Range   HIV 1&2 Ab, 4th Generation NON-REACTIVE NON-REACTIVE  Lipid panel  Result Value Ref Range   Cholesterol 190 <200 mg/dL   HDL 41 > OR = 40 mg/dL   Triglycerides 098 <119 mg/dL   LDL Cholesterol (Calc) 124 (H) mg/dL (calc)   Total CHOL/HDL Ratio  4.6 <5.0 (calc)   Non-HDL Cholesterol (Calc) 149 (H) <130 mg/dL (calc)  Comprehensive metabolic panel  Result Value Ref Range   Glucose, Bld 91 65 - 99 mg/dL   BUN 16 7 - 25 mg/dL   Creat 8.11 9.14 - 7.82 mg/dL   BUN/Creatinine Ratio NOT APPLICABLE 6 - 22 (calc)   Sodium 141 135 - 146 mmol/L   Potassium 4.1 3.5 - 5.3 mmol/L   Chloride 103 98 - 110 mmol/L   CO2 27 20 - 32 mmol/L   Calcium 9.3 8.6 - 10.3 mg/dL   Total Protein 7.0 6.1 - 8.1 g/dL   Albumin 4.6 3.6 - 5.1 g/dL   Globulin 2.4 1.9 - 3.7 g/dL (calc)   AG Ratio 1.9 1.0 - 2.5 (calc)   Total Bilirubin 0.6 0.2 - 1.2 mg/dL   Alkaline phosphatase (APISO) 63 35 - 144 U/L   AST 17 10 - 35 U/L   ALT 25 9 - 46 U/L  Vitamin B12  Result Value Ref Range   Vitamin B-12 1,536 (H) 200 - 1,100 pg/mL  Hemoglobin A1c  Result Value Ref Range   Hgb A1c MFr Bld 5.8 (H) <5.7 % of total Hgb   Mean Plasma Glucose 120 mg/dL   eAG (mmol/L) 6.6 mmol/L    Assessment & Plan:   Problem List Items Addressed This Visit     Allergic rhinitis    Continue flonase      Relevant Medications   fluticasone (FLONASE) 50 MCG/ACT nasal spray   Healthcare maintenance - Primary (Chronic)    Preventative protocols reviewed and updated unless pt declined. Discussed healthy diet and lifestyle.  He agrees to colonoscopy referral.       Anxiety with depression    Chronic, stable period on sertraline 50mg  daily. Continue.      Relevant Medications   sertraline (ZOLOFT) 50 MG tablet   Obesity, Class II, BMI 35-39.9, no comorbidity    Encouraged healthy diet and lifestyle choices to affect sustainable weight loss. Recommend he restart walking routine as well as decrease  portion sizes. Discussed GLP1RA injectable weight loss medications - if interested, he should check with employer to see if the cover these.       OSA on CPAP   HLD (hyperlipidemia)    Update FLP off medication. The 10-year ASCVD risk score (Arnett DK, et al., 2019) is: 4.5%   Values used to calculate the score:     Age: 52 years     Sex: Male     Is Non-Hispanic African American: No     Diabetic: No     Tobacco smoker: No     Systolic Blood Pressure: 130 mmHg     Is BP treated: No     HDL Cholesterol: 41 mg/dL     Total Cholesterol: 190 mg/dL       Relevant Orders   Lipid panel   Comprehensive metabolic panel   Low serum vitamin B12    Update levels off b12 replacement.       Relevant Orders   Vitamin B12   Prediabetes    Update A1c.       Relevant Orders   Hemoglobin A1c   Other Visit Diagnoses     Special screening for malignant neoplasm of prostate       Relevant Orders   PSA   Special screening for malignant neoplasms, colon       Relevant Orders   Ambulatory referral to Gastroenterology  Meds ordered this encounter  Medications   sertraline (ZOLOFT) 50 MG tablet    Sig: Take 1 tablet (50 mg total) by mouth daily.    Dispense:  90 tablet    Refill:  4   fluticasone (FLONASE) 50 MCG/ACT nasal spray    Sig: Place 2 sprays into both nostrils daily.    Dispense:  48 mL    Refill:  3    Orders Placed This Encounter  Procedures   Lipid panel   Comprehensive metabolic panel   Hemoglobin A1c   PSA   Vitamin B12   Ambulatory referral to Gastroenterology    Referral Priority:   Routine    Referral Type:   Consultation    Referral Reason:   Specialty Services Required    Number of Visits Requested:   1    Patient Instructions  Check at home on records for hepatitis B shot.  We will refer you for colonoscopy Good to see you today  Return as needed or in 1 year for next physical.  Follow up plan: Return in about 1 year (around 05/03/2024)  for annual exam, prior fasting for blood work.  Eustaquio Boyden, MD

## 2023-05-05 ENCOUNTER — Encounter: Payer: Self-pay | Admitting: Family Medicine

## 2023-05-05 LAB — PSA: PSA: 0.91 ng/mL (ref 0.10–4.00)

## 2023-05-05 LAB — COMPREHENSIVE METABOLIC PANEL
ALT: 26 U/L (ref 0–53)
AST: 21 U/L (ref 0–37)
Albumin: 4.2 g/dL (ref 3.5–5.2)
Alkaline Phosphatase: 60 U/L (ref 39–117)
BUN: 13 mg/dL (ref 6–23)
CO2: 28 mEq/L (ref 19–32)
Calcium: 9 mg/dL (ref 8.4–10.5)
Chloride: 101 mEq/L (ref 96–112)
Creatinine, Ser: 0.98 mg/dL (ref 0.40–1.50)
GFR: 89.22 mL/min (ref >60.00)
Glucose, Bld: 86 mg/dL (ref 70–99)
Potassium: 3.7 mEq/L (ref 3.5–5.1)
Sodium: 137 mEq/L (ref 135–145)
Total Bilirubin: 0.6 mg/dL (ref 0.2–1.2)
Total Protein: 6.8 g/dL (ref 6.0–8.3)

## 2023-05-05 LAB — HEMOGLOBIN A1C: Hgb A1c MFr Bld: 5.8 % (ref 4.6–6.5)

## 2023-05-05 LAB — LIPID PANEL
Cholesterol: 171 mg/dL (ref 0–200)
HDL: 39.2 mg/dL (ref >39.00)
LDL Cholesterol: 112 mg/dL — ABNORMAL HIGH (ref 0–99)
NonHDL: 131.36
Total CHOL/HDL Ratio: 4
Triglycerides: 97 mg/dL (ref 0.0–149.0)
VLDL: 19.4 mg/dL (ref 0.0–40.0)

## 2023-05-05 LAB — VITAMIN B12: Vitamin B-12: 516 pg/mL (ref 211–911)

## 2023-05-06 ENCOUNTER — Telehealth: Payer: Self-pay

## 2023-05-06 ENCOUNTER — Other Ambulatory Visit: Payer: Self-pay

## 2023-05-06 DIAGNOSIS — Z1211 Encounter for screening for malignant neoplasm of colon: Secondary | ICD-10-CM

## 2023-05-06 MED ORDER — NA SULFATE-K SULFATE-MG SULF 17.5-3.13-1.6 GM/177ML PO SOLN: 1.0000 | Freq: Once | ORAL | 0 refills | Status: AC

## 2023-05-06 NOTE — Telephone Encounter (Signed)
Gastroenterology Pre-Procedure Review  Request Date: 06/01/23 Requesting Physician: Dr. Tobi Bastos  PATIENT REVIEW QUESTIONS: The patient responded to the following health history questions as indicated:    1. Are you having any GI issues? no 2. Do you have a personal history of Polyps? no 3. Do you have a family history of Colon Cancer or Polyps? no 4. Diabetes Mellitus? no 5. Joint replacements in the past 12 months?no 6. Major health problems in the past 3 months?no 7. Any artificial heart valves, MVP, or defibrillator?no    MEDICATIONS & ALLERGIES:    Patient reports the following regarding taking any anticoagulation/antiplatelet therapy:   Plavix, Coumadin, Eliquis, Xarelto, Lovenox, Pradaxa, Brilinta, or Effient? no Aspirin? no  Patient confirms/reports the following medications:  Current Outpatient Medications  Medication Sig Dispense Refill   ALPRAZolam (XANAX) 0.25 MG tablet Take 1 tablet (0.25 mg total) by mouth at bedtime as needed. 30 tablet 0   fluticasone (FLONASE) 50 MCG/ACT nasal spray Place 2 sprays into both nostrils daily. 48 mL 3   Multiple Vitamins-Minerals (MULTIVITAMIN ADULTS 50+) TABS      sertraline (ZOLOFT) 50 MG tablet Take 1 tablet (50 mg total) by mouth daily. 90 tablet 4   No current facility-administered medications for this visit.    Patient confirms/reports the following allergies:  Allergies  Allergen Reactions   Citalopram Hydrobromide Other (See Comments)    tachycardia    No orders of the defined types were placed in this encounter.   AUTHORIZATION INFORMATION Primary Insurance: 1D#: Group #:  Secondary Insurance: 1D#: Group #:  SCHEDULE INFORMATION: Date: 06/01/23 Time: Location: [facility]

## 2023-05-10 MED ORDER — WEGOVY 0.25 MG/0.5ML ~~LOC~~ SOAJ: SUBCUTANEOUS | 0 refills | Status: DC

## 2023-05-10 MED ORDER — WEGOVY 0.5 MG/0.5ML ~~LOC~~ SOAJ: 0.5000 mg | SUBCUTANEOUS | 1 refills | Status: DC

## 2023-05-10 NOTE — Telephone Encounter (Signed)
Please send to pharmacy team to submit PA for [facility].

## 2023-05-10 NOTE — Addendum Note (Signed)
Addended by: Eustaquio Boyden on: 05/10/2023 10:51 PM   Modules accepted: Orders

## 2023-05-12 ENCOUNTER — Telehealth: Payer: Self-pay

## 2023-05-12 NOTE — Telephone Encounter (Signed)
Pharmacy Patient Advocate Encounter   Received notification from [facility] that prior authorization for Wegovy 0.25MG /0.5ML auto-injectors is required/requested.   PA submitted on 05/12/23 to (ins) [facility] via CoverMyMeds Key or [facility]) confirmation # [id number] Status is pending

## 2023-05-13 ENCOUNTER — Other Ambulatory Visit (HOSPITAL_COMMUNITY): Payer: Self-pay

## 2023-05-13 NOTE — Telephone Encounter (Signed)
Patient Advocate Encounter  Prior Authorization for [employer] 0.25MG /0.5ML auto-injectors has been approved with [employer].    Effective dates: 05/12/23 through 12/12/23

## 2023-05-22 ENCOUNTER — Ambulatory Visit
Admission: EM | Admit: 2023-05-22 | Discharge: 2023-05-22 | Disposition: A | Discharge status: Home / Self Care | Payer: 59 | Attending: Urgent Care | Admitting: Urgent Care

## 2023-05-22 DIAGNOSIS — M545 Low back pain, unspecified: Secondary | ICD-10-CM

## 2023-05-22 MED ORDER — KETOROLAC TROMETHAMINE 60 MG/2ML IM SOLN
60.0000 mg | Freq: Once | INTRAMUSCULAR | Status: AC
  Administered 2023-05-22: 60 mg via INTRAMUSCULAR

## 2023-05-22 MED ORDER — CYCLOBENZAPRINE HCL 10 MG PO TABS
10.0000 mg | ORAL_TABLET | Freq: Two times a day (BID) | ORAL | 0 refills | Status: DC | PRN
Start: 2023-05-22 — End: 2024-08-09

## 2023-05-22 NOTE — Discharge Instructions (Addendum)
Follow up here or with your primary care provider if your symptoms are worsening or not improving with treatment.     

## 2023-05-22 NOTE — ED Provider Notes (Signed)
[facility]    CSN: [id number] Arrival date & time: 05/22/23  1520      History   Chief Complaint No chief complaint on file.   HPI Nicholas Saunders is a 52 y.o. male.   HPI  Patient presents to urgent care with complaint of back pain which started last night.  He endorses sharp pain in the right lower back.  Taking Goody's powder as well as Aspercreme with little relief.  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     Home Medications    Prior to Admission medications   Medication Sig Start Date End Date Taking? Authorizing Provider  ALPRAZolam (XANAX) 0.25 MG tablet Take 1 tablet (0.25 mg total) by mouth at bedtime as needed. 07/10/22   Eustaquio Boyden, MD  fluticasone [facility]) 50 MCG/ACT nasal spray Place 2 sprays into both nostrils daily. 05/04/23   Eustaquio Boyden, MD  Multiple Vitamins-Minerals (MULTIVITAMIN ADULTS 50+) TABS     [provider]  Semaglutide-Weight Management (WEGOVY) 0.25 MG/0.5ML SOAJ Inject 0.25 mg into the skin once a week for 28 days, THEN 0.5 mg once a week for 28 days. 05/10/23 07/05/23  Eustaquio Boyden, MD  Semaglutide-Weight Management  [facility]) 0.5 MG/0.5ML SOAJ Inject 0.5 mg into the skin once a week. 06/07/23   Eustaquio Boyden, MD  sertraline (ZOLOFT) 50 MG tablet Take 1 tablet (50 mg total) by mouth daily. 05/04/23   Eustaquio Boyden, MD    Family History Family History  Problem Relation Age of Onset   Alcohol abuse Father    Cirrhosis Father        Alcohol induced   Cancer Mother 77       esophageal (smoker)   Alcohol abuse Brother    Anxiety disorder Sister    Stroke Maternal Grandfather    Heart disease Maternal Grandfather    Heart disease Maternal Grandmother        and MGGM   Alcohol abuse Paternal Grandfather    Diabetes Neg Hx     Social History Social History   Tobacco Use   Smoking status: Never   Smokeless tobacco: Former    Types: Snuff  Substance Use Topics   Alcohol use: No   Drug use: No     Allergies   Citalopram hydrobromide   Review of Systems Review of Systems   Physical Exam Triage Vital Signs ED Triage Vitals  Enc Vitals Group     BP      Pulse      Resp      Temp      Temp src  SpO2      Weight      Height      Head Circumference      Peak Flow      Pain Score      Pain Loc      Pain Edu?      Excl. in GC?    No data found.  Updated Vital Signs There were no vitals taken for this visit.  Visual Acuity Right Eye Distance:   Left Eye Distance:   Bilateral Distance:    Right Eye Near:   Left Eye Near:    Bilateral Near:     Physical Exam Vitals reviewed.  Constitutional:      Appearance: Normal appearance.  Skin:    General: Skin is warm and dry.  Neurological:     General: No focal deficit present.     Mental Status: He is alert and oriented to person, place, and time.  Psychiatric:        Mood and Affect: Mood normal.        Behavior: Behavior normal.      UC Treatments / Results  Labs (all labs ordered are listed, but only abnormal results are displayed) Labs Reviewed - No data to display  EKG   Radiology No results  found.  Procedures Procedures (including critical care time)  Medications Ordered in UC Medications - No data to display  Initial Impression / Assessment and Plan / UC Course  I have reviewed the triage vital signs and the nursing notes.  Pertinent labs & imaging results that were available during my care of the patient were reviewed by me and considered in my medical decision making (see chart for details).   Nicholas Saunders is a 52 y.o. male presenting with acute R-sided lower back pain. Patient is afebrile without recent antipyretics, satting well on room air. Overall is well appearing though non-toxic, well hydrated, without respiratory distress.  Antalgic gait.  No spinal process or bony tenderness.  Right sided muscular tenderness with palpation.  Reviewed relevant chart history.  Suspect muscle spasm given his lack of red flags and negative history of injury or trauma.  Administering Toradol 60 mg IM in clinic and will discharge with a muscle relaxant.  Advised patient of side effect of sedation.  Counseled patient on potential for adverse effects with medications prescribed/recommended today, ER and return-to-clinic precautions discussed, patient verbalized understanding and agreement with care plan.  Final Clinical Impressions(s) / UC Diagnoses   Final diagnoses:  None   Discharge Instructions   None    ED Prescriptions   None    PDMP not reviewed this encounter.   Charma Igo, [place] 05/22/23 1544

## 2023-05-22 NOTE — ED Triage Notes (Signed)
Back pain that started last night after patient tried to step over a dog toy and stepped wrong. Now having sharp pain on right lower back. Taking goodies powder and asper cream on back with no little relief.

## 2023-05-24 NOTE — Addendum Note (Signed)
Addended by: Eustaquio Boyden on: 05/24/2023 07:58 AM   Modules accepted: Orders

## 2023-05-25 ENCOUNTER — Encounter: Payer: Self-pay | Admitting: Gastroenterology

## 2023-06-01 ENCOUNTER — Ambulatory Visit
Admission: RE | Admit: 2023-06-01 | Discharge: 2023-06-01 | Disposition: A | Discharge status: Home / Self Care | Payer: 59 | Attending: Gastroenterology | Admitting: Gastroenterology

## 2023-06-01 ENCOUNTER — Encounter: Payer: Self-pay | Admitting: Gastroenterology

## 2023-06-01 ENCOUNTER — Encounter
Admission: RE | Disposition: A | Discharge status: Home / Self Care | Payer: Self-pay | Source: Home / Self Care | Attending: Gastroenterology

## 2023-06-01 ENCOUNTER — Ambulatory Visit: Payer: 59 | Admitting: Registered Nurse

## 2023-06-01 DIAGNOSIS — D126 Benign neoplasm of colon, unspecified: Secondary | ICD-10-CM | POA: Diagnosis not present

## 2023-06-01 DIAGNOSIS — Z87891 Personal history of nicotine dependence: Secondary | ICD-10-CM | POA: Diagnosis not present

## 2023-06-01 DIAGNOSIS — D123 Benign neoplasm of transverse colon: Secondary | ICD-10-CM | POA: Diagnosis not present

## 2023-06-01 DIAGNOSIS — Z8379 Family history of other diseases of the digestive system: Secondary | ICD-10-CM | POA: Insufficient documentation

## 2023-06-01 DIAGNOSIS — Z1211 Encounter for screening for malignant neoplasm of colon: Secondary | ICD-10-CM | POA: Insufficient documentation

## 2023-06-01 DIAGNOSIS — Z0000 Encounter for general adult medical examination without abnormal findings: Secondary | ICD-10-CM

## 2023-06-01 HISTORY — PX: COLONOSCOPY WITH PROPOFOL: SHX5780

## 2023-06-01 HISTORY — DX: Sleep apnea, unspecified: G47.30

## 2023-06-01 HISTORY — PX: POLYPECTOMY: SHX5525

## 2023-06-01 SURGERY — COLONOSCOPY WITH PROPOFOL
Anesthesia: General

## 2023-06-01 MED ORDER — LIDOCAINE HCL (PF) 2 % IJ SOLN
INTRAMUSCULAR | Status: AC
  Filled 2023-06-01: qty 5

## 2023-06-01 MED ORDER — PROPOFOL 10 MG/ML IV BOLUS
INTRAVENOUS | Status: AC
  Filled 2023-06-01: qty 40

## 2023-06-01 MED ORDER — PROPOFOL 500 MG/50ML IV EMUL
INTRAVENOUS | Status: DC | PRN
  Administered 2023-06-01: 125 ug/kg/min via INTRAVENOUS

## 2023-06-01 MED ORDER — PROPOFOL 10 MG/ML IV BOLUS
INTRAVENOUS | Status: DC | PRN
  Administered 2023-06-01: 90 mg via INTRAVENOUS

## 2023-06-01 MED ORDER — LIDOCAINE HCL (CARDIAC) PF 100 MG/5ML IV SOSY
PREFILLED_SYRINGE | INTRAVENOUS | Status: DC | PRN
  Administered 2023-06-01: 80 mg via INTRAVENOUS

## 2023-06-01 MED ORDER — PROPOFOL 1000 MG/100ML IV EMUL
INTRAVENOUS | Status: AC
  Filled 2023-06-01: qty 100

## 2023-06-01 MED ORDER — SODIUM CHLORIDE 0.9 % IV SOLN: INTRAVENOUS | Status: DC

## 2023-06-01 NOTE — H&P (Signed)
Wyline Mood, MD [address], [address], [place], [place], [address] [address], [address], [place], [place], [address] Phone: [phone]  Fax: [phone]  Primary Care Physician:  Eustaquio Boyden, MD   Pre-Procedure History & Physical: HPI:  Nicholas Saunders is a 52 y.o. male is here for an colonoscopy.   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 No pertinent surgical history.  Prior to Admission medications   Medication Sig Start Date End Date Taking? Authorizing Provider  Multiple Vitamins-Minerals (MULTIVITAMIN ADULTS 50+) TABS    Yes [provider]  sertraline (ZOLOFT) 50 MG tablet Take 1 tablet (50 mg total) by mouth daily. 05/04/23  Yes Eustaquio Boyden, MD  ALPRAZolam [facility]) 0.25 MG tablet Take 1 tablet (0.25 mg total) by mouth at bedtime as needed. 07/10/22   Eustaquio Boyden, MD  cyclobenzaprine (FLEXERIL) 10 MG tablet Take 1 tablet (10 mg total) by mouth 2 (two) times daily as needed for muscle spasms. 05/22/23   Immordino, Jeannett Senior, FNP  fluticasone (FLONASE) 50 MCG/ACT nasal spray Place 2 sprays into both nostrils daily. 05/04/23   Eustaquio Boyden, MD    Allergies as of 05/06/2023 - Review Complete 05/06/2023  Allergen Reaction Noted   Citalopram hydrobromide Other (See Comments) 01/01/2012    Family History  Problem Relation Age of Onset   Alcohol abuse Father    Cirrhosis Father        Alcohol induced   Cancer Mother 58       esophageal (smoker)   Alcohol abuse Brother    Anxiety disorder Sister    Stroke Maternal Grandfather    Heart disease Maternal Grandfather    Heart disease Maternal  Grandmother        and MGGM   Alcohol abuse Paternal Grandfather    Diabetes Neg Hx     Social History   Socioeconomic History   Marital status: Married    Spouse name: Not on file   Number of children: 0   Years of education: 12   Highest education level: Not on file  Occupational History   Occupation: Advertising account planner: [employer]  Tobacco Use   Smoking status: Never   Smokeless tobacco: Former    Types: Snuff  Vaping Use   Vaping Use: Never used  Substance and Sexual Activity   Alcohol use: No   Drug use: No   Sexual activity: Not on file  Other Topics Concern   Not on file  Social History Narrative   Caffeine: 3-4 caffeinated beverages   Married.  Lives with wife, 1 dog.   No children   Marine scientist, works from home   Activity: started swimming, biking   Diet: water, tries to get fruits and vegetables   Edu: 12th grade   Social Determinants of Corporate investment banker Strain: Not on file  Food Insecurity: Not on file  Transportation Needs: Not on file  Physical Activity: Not on file  Stress: Not on file  Social Connections: Not on file  Intimate Partner Violence: Not on  file    Review of Systems: See HPI, otherwise negative ROS  Physical Exam: BP 134/85   Pulse 79   Temp (!) 95.7 F (35.4 C) (Temporal)   Resp 18   Ht 5\' 6"  (1.676 m)   Wt 102.1 kg   SpO2 98%   BMI 36.32 kg/m  General:   Alert,  pleasant and cooperative in NAD Head:  Normocephalic and atraumatic. Neck:  Supple; no masses or thyromegaly. Lungs:  Clear throughout to auscultation, normal respiratory effort.    Heart:  +S1, +S2, Regular rate and rhythm, No edema. Abdomen:  Soft, nontender and nondistended. Normal bowel sounds, without guarding, and without rebound.   Neurologic:  Alert and  oriented x4;  grossly normal neurologically.  Impression/Plan: Nicholas Saunders is here for an colonoscopy to be performed for Screening colonoscopy average risk   Risks,  benefits, limitations, and alternatives regarding  colonoscopy have been reviewed with the patient.  Questions have been answered.  All parties agreeable.   Wyline Mood, MD  06/01/2023, 7:36 AM

## 2023-06-01 NOTE — Op Note (Signed)
[facility] Gastroenterology Patient Name: Nicholas Saunders Procedure Date: 06/01/2023 7:11 AM MRN: [id number] Account #: [id number] Date of Birth: Jun 27, 1971 Admit Type: Outpatient Age: 52 Room: [facility] ENDO ROOM 2 Gender: Male Note Status: Finalized Instrument Name: Nelda Marseille [id number] Procedure:             Colonoscopy Indications:           Screening for colorectal malignant neoplasm Providers:             Wyline Mood MD, MD Referring MD:          Eustaquio Boyden (Referring MD) Medicines:             Monitored Anesthesia Care Complications:         No immediate complications. Procedure:             Pre-Anesthesia Assessment:                        - Prior to the procedure, a History and Physical was                         performed, and patient medications, allergies and                         sensitivities were reviewed. The patient's tolerance                         of previous anesthesia was reviewed.                        - The risks and benefits of the procedure and the                         sedation options and risks were discussed with the                         patient. All questions were answered and informed                         consent was obtained.                        - ASA Grade Assessment: II - A patient with mild                         systemic disease.                        After obtaining informed consent, the colonoscope was                         passed under direct vision. Throughout the procedure,                         the patient's blood pressure, pulse, and oxygen                         saturations were monitored continuously. The                         Colonoscope was introduced through  the anus and                         advanced to the the cecum, identified by the                         appendiceal orifice. The colonoscopy was performed                         with ease. The patient tolerated the procedure well.                          The quality of the bowel preparation was excellent.                         The ileocecal valve, appendiceal orifice, and rectum                         were photographed. Findings:      The perianal and digital rectal examinations were normal.      Two sessile polyps were found in the transverse colon. The polyps were 5       to 7 mm in size. These polyps were removed with a cold snare. Resection       and retrieval were complete.      The exam was otherwise without abnormality on direct and retroflexion       views. Impression:            - Two 5 to 7 mm polyps in the transverse colon,                         removed with a cold snare. Resected and retrieved.                        - The examination was otherwise normal on direct and                         retroflexion views. Recommendation:        - Discharge patient to home (with escort).                        - Resume previous diet.                        - Continue present medications.                        - Await pathology results.                        - Repeat colonoscopy for surveillance based on                         pathology results. Procedure Code(s):     --- Professional ---                        [phone], Colonoscopy, flexible; with removal of                         tumor(s), polyp(s),  or other lesion(s) by snare                         technique Diagnosis Code(s):     --- Professional ---                        Z12.11, Encounter for screening for malignant neoplasm                         of colon                        D12.3, Benign neoplasm of transverse colon (hepatic                         flexure or splenic flexure) CPT copyright 2022 American Medical Association. All rights reserved. The codes documented in this report are preliminary and upon coder review may  be revised to meet current compliance requirements. Wyline Mood, MD Wyline Mood MD, MD 06/01/2023 8:04:18 AM This report has  been signed electronically. Number of Addenda: 0 Note Initiated On: 06/01/2023 7:11 AM Scope Withdrawal Time: 0 hours 9 minutes 54 seconds  Total Procedure Duration: 0 hours 11 minutes 35 seconds  Estimated Blood Loss:  Estimated blood loss: none.      [facility]

## 2023-06-01 NOTE — Transfer of Care (Signed)
Immediate Anesthesia Transfer of Care Note  Patient: Ferd Musa  Procedure(s) Performed: COLONOSCOPY WITH PROPOFOL  Patient Location: PACU  Anesthesia Type:General  Level of Consciousness: awake, alert , and oriented  Airway & Oxygen Therapy: Patient Spontanous Breathing  Post-op Assessment: Report given to RN and Post -op Vital signs reviewed and stable  Post vital signs: stable  Last Vitals:  Vitals Value Taken Time  BP 101/56 06/01/23 0808  Temp    Pulse 78 06/01/23 0808  Resp 20 06/01/23 0807  SpO2 98 % 06/01/23 0808  Vitals shown include unvalidated device data.  Last Pain:  Vitals:   06/01/23 0808  TempSrc:   PainSc: 0-No pain         Complications: No notable events documented.

## 2023-06-01 NOTE — Anesthesia Preprocedure Evaluation (Signed)
Anesthesia Evaluation  Patient identified by MRN, date of birth, ID band Patient awake    Reviewed: Allergy & Precautions, NPO status , Patient's Chart, lab work & pertinent test results  History of Anesthesia Complications Negative for: history of anesthetic complications  Airway Mallampati: III  TM Distance: <3 FB Neck ROM: full    Dental  (+) Chipped   Pulmonary neg shortness of breath, sleep apnea    Pulmonary exam normal        Cardiovascular negative cardio ROS Normal cardiovascular exam     Neuro/Psych negative neurological ROS  negative psych ROS   GI/Hepatic Neg liver ROS,GERD  Controlled,,  Endo/Other  negative endocrine ROS    Renal/GU negative Renal ROS  negative genitourinary   Musculoskeletal   Abdominal   Peds  Hematology negative hematology ROS (+)   Anesthesia Other Findings Past Medical History: No date: Anxiety     Comment:  plane rides 2004: H/O: Bell's palsy age [age]: H/O: pneumonia 08/18/2018: Hx of squamous cell carcinoma     Comment:  right top of shoulder 07/14/2007: PNEUMONIA, HX OF     Comment:  Qualifier: Diagnosis of  By: Jillyn Hidden FNP, Mcarthur Rossetti  08/2018: SCC (squamous cell carcinoma), shoulder     Comment:  removed by derm No date: Sleep apnea  History reviewed. No pertinent surgical history.  BMI    Body Mass Index: 36.32 kg/m      Reproductive/Obstetrics negative OB ROS                             Anesthesia Physical Anesthesia Plan  ASA: 2  Anesthesia Plan: General   Post-op Pain Management:    Induction: Intravenous  PONV Risk Score and Plan: Propofol infusion and TIVA  Airway Management Planned: Natural Airway and Nasal Cannula  Additional Equipment:   Intra-op Plan:   Post-operative Plan:   Informed Consent: I have reviewed the patients History and Physical, chart, labs and discussed the procedure  including the risks, benefits and alternatives for the proposed anesthesia with the patient or authorized representative who has indicated his/her understanding and acceptance.     Dental Advisory Given  Plan Discussed with: Anesthesiologist, CRNA and Surgeon  Anesthesia Plan Comments: (Patient consented for risks of anesthesia including but not limited to:  - adverse reactions to medications - risk of airway placement if required - damage to eyes, teeth, lips or other oral mucosa - nerve damage due to positioning  - sore throat or hoarseness - Damage to heart, brain, nerves, lungs, other parts of body or loss of life  Patient voiced understanding.)       Anesthesia Quick Evaluation

## 2023-06-01 NOTE — Anesthesia Postprocedure Evaluation (Signed)
Anesthesia Post Note  Patient: Nicholas Saunders  Procedure(s) Performed: COLONOSCOPY WITH PROPOFOL  Patient location during evaluation: Endoscopy Anesthesia Type: General Level of consciousness: awake and alert Pain management: pain level controlled Vital Signs Assessment: post-procedure vital signs reviewed and stable Respiratory status: spontaneous breathing, nonlabored ventilation, respiratory function stable and patient connected to nasal cannula oxygen Cardiovascular status: blood pressure returned to baseline and stable Postop Assessment: no apparent nausea or vomiting Anesthetic complications: no   No notable events documented.   Last Vitals:  Vitals:   06/01/23 0808 06/01/23 0818  BP: (!) 101/56 120/78  Pulse: 78 67  Resp: 20 (!) 21  Temp:    SpO2: 98% 100%    Last Pain:  Vitals:   06/01/23 0818  TempSrc:   PainSc: 0-No pain                 Cleda Mccreedy Jamin Humphries

## 2023-06-02 ENCOUNTER — Encounter: Payer: Self-pay | Admitting: Gastroenterology

## 2023-06-03 ENCOUNTER — Encounter: Payer: Self-pay | Admitting: Gastroenterology

## 2023-06-06 ENCOUNTER — Encounter: Payer: Self-pay | Admitting: Family Medicine

## 2023-09-27 ENCOUNTER — Other Ambulatory Visit: Payer: Self-pay | Admitting: Family Medicine

## 2023-09-27 NOTE — Telephone Encounter (Signed)
LAST APPOINTMENT DATE: 05/04/23   NEXT APPOINTMENT DATE: Visit date not found    LAST REFILL: 07/10/22  QTY: 30 no rf

## 2023-09-28 ENCOUNTER — Other Ambulatory Visit: Payer: Self-pay | Admitting: Family Medicine

## 2023-09-29 MED ORDER — ALPRAZOLAM 0.25 MG PO TABS: 0.2500 mg | ORAL_TABLET | Freq: Every evening | ORAL | 0 refills | Status: DC | PRN

## 2023-09-29 NOTE — Telephone Encounter (Signed)
ERx 

## 2024-06-01 ENCOUNTER — Other Ambulatory Visit: Payer: Self-pay | Admitting: Family Medicine

## 2024-06-01 DIAGNOSIS — F418 Other specified anxiety disorders: Secondary | ICD-10-CM

## 2024-06-02 NOTE — Telephone Encounter (Signed)
 E-scribed refill  Plz schedule CPE and fasting labs (no food/drink- except water and/or blk coffee 5 hrs prior) for additional refills.

## 2024-06-02 NOTE — Telephone Encounter (Signed)
 Noted

## 2024-06-02 NOTE — Telephone Encounter (Signed)
 Spoke to pt, sch cpe for 08/09/24

## 2024-06-14 ENCOUNTER — Other Ambulatory Visit: Payer: Self-pay | Admitting: Family Medicine

## 2024-06-14 DIAGNOSIS — J309 Allergic rhinitis, unspecified: Secondary | ICD-10-CM

## 2024-08-09 ENCOUNTER — Encounter: Payer: Self-pay | Admitting: Family Medicine

## 2024-08-09 ENCOUNTER — Ambulatory Visit (INDEPENDENT_AMBULATORY_CARE_PROVIDER_SITE_OTHER): Admitting: Family Medicine

## 2024-08-09 VITALS — BP 130/82 | HR 63 | Temp 98.3°F | Ht 66.0 in | Wt 226.0 lb

## 2024-08-09 DIAGNOSIS — R7303 Prediabetes: Secondary | ICD-10-CM

## 2024-08-09 DIAGNOSIS — E538 Deficiency of other specified B group vitamins: Secondary | ICD-10-CM | POA: Diagnosis not present

## 2024-08-09 DIAGNOSIS — E7800 Pure hypercholesterolemia, unspecified: Secondary | ICD-10-CM

## 2024-08-09 DIAGNOSIS — Z0000 Encounter for general adult medical examination without abnormal findings: Secondary | ICD-10-CM | POA: Diagnosis not present

## 2024-08-09 DIAGNOSIS — Z125 Encounter for screening for malignant neoplasm of prostate: Secondary | ICD-10-CM | POA: Diagnosis not present

## 2024-08-09 DIAGNOSIS — J309 Allergic rhinitis, unspecified: Secondary | ICD-10-CM | POA: Diagnosis not present

## 2024-08-09 DIAGNOSIS — R221 Localized swelling, mass and lump, neck: Secondary | ICD-10-CM | POA: Diagnosis not present

## 2024-08-09 DIAGNOSIS — E66812 Obesity, class 2: Secondary | ICD-10-CM

## 2024-08-09 DIAGNOSIS — G4733 Obstructive sleep apnea (adult) (pediatric): Secondary | ICD-10-CM

## 2024-08-09 DIAGNOSIS — Z0001 Encounter for general adult medical examination with abnormal findings: Secondary | ICD-10-CM | POA: Diagnosis not present

## 2024-08-09 DIAGNOSIS — M25571 Pain in right ankle and joints of right foot: Secondary | ICD-10-CM | POA: Diagnosis not present

## 2024-08-09 DIAGNOSIS — F418 Other specified anxiety disorders: Secondary | ICD-10-CM

## 2024-08-09 DIAGNOSIS — F40243 Fear of flying: Secondary | ICD-10-CM

## 2024-08-09 DIAGNOSIS — Z6836 Body mass index (BMI) 36.0-36.9, adult: Secondary | ICD-10-CM

## 2024-08-09 LAB — CBC WITH DIFFERENTIAL/PLATELET
Basophils Absolute: 0 K/uL (ref 0.0–0.1)
Basophils Relative: 0.8 % (ref 0.0–3.0)
Eosinophils Absolute: 0.1 K/uL (ref 0.0–0.7)
Eosinophils Relative: 1.7 % (ref 0.0–5.0)
HCT: 46.3 % (ref 39.0–52.0)
Hemoglobin: 15.2 g/dL (ref 13.0–17.0)
Lymphocytes Relative: 30.2 % (ref 12.0–46.0)
Lymphs Abs: 1.8 K/uL (ref 0.7–4.0)
MCHC: 32.9 g/dL (ref 30.0–36.0)
MCV: 85.3 fl (ref 78.0–100.0)
Monocytes Absolute: 0.5 K/uL (ref 0.1–1.0)
Monocytes Relative: 7.7 % (ref 3.0–12.0)
Neutro Abs: 3.6 K/uL (ref 1.4–7.7)
Neutrophils Relative %: 59.6 % (ref 43.0–77.0)
Platelets: 217 K/uL (ref 150.0–400.0)
RBC: 5.43 Mil/uL (ref 4.22–5.81)
RDW: 14.3 % (ref 11.5–15.5)
WBC: 6 K/uL (ref 4.0–10.5)

## 2024-08-09 LAB — COMPREHENSIVE METABOLIC PANEL WITH GFR
ALT: 22 U/L (ref 0–53)
AST: 23 U/L (ref 0–37)
Albumin: 4.4 g/dL (ref 3.5–5.2)
Alkaline Phosphatase: 71 U/L (ref 39–117)
BUN: 18 mg/dL (ref 6–23)
CO2: 28 meq/L (ref 19–32)
Calcium: 9 mg/dL (ref 8.4–10.5)
Chloride: 104 meq/L (ref 96–112)
Creatinine, Ser: 0.94 mg/dL (ref 0.40–1.50)
GFR: 92.96 mL/min (ref >60.00)
Glucose, Bld: 104 mg/dL — ABNORMAL HIGH (ref 70–99)
Potassium: 4.3 meq/L (ref 3.5–5.1)
Sodium: 141 meq/L (ref 135–145)
Total Bilirubin: 0.6 mg/dL (ref 0.2–1.2)
Total Protein: 7.2 g/dL (ref 6.0–8.3)

## 2024-08-09 LAB — PSA: PSA: 1.05 ng/mL (ref 0.10–4.00)

## 2024-08-09 LAB — LIPID PANEL
Cholesterol: 196 mg/dL (ref 0–200)
HDL: 49.3 mg/dL (ref >39.00)
LDL Cholesterol: 121 mg/dL — ABNORMAL HIGH (ref 0–99)
NonHDL: 146.21
Total CHOL/HDL Ratio: 4
Triglycerides: 124 mg/dL (ref 0.0–149.0)
VLDL: 24.8 mg/dL (ref 0.0–40.0)

## 2024-08-09 LAB — VITAMIN B12: Vitamin B-12: 387 pg/mL (ref 211–911)

## 2024-08-09 LAB — TSH: TSH: 1.89 u[IU]/mL (ref 0.35–5.50)

## 2024-08-09 LAB — HEMOGLOBIN A1C: Hgb A1c MFr Bld: 6.4 % (ref 4.6–6.5)

## 2024-08-09 MED ORDER — FLUTICASONE PROPIONATE 50 MCG/ACT NA SUSP
2.0000 | Freq: Every day | NASAL | 3 refills | Status: AC
Start: 2024-08-09 — End: ?

## 2024-08-09 MED ORDER — SERTRALINE HCL 50 MG PO TABS
50.0000 mg | ORAL_TABLET | Freq: Every day | ORAL | 3 refills | Status: AC
Start: 2024-08-09 — End: ?

## 2024-08-09 MED ORDER — ALPRAZOLAM 0.25 MG PO TABS: 0.2500 mg | ORAL_TABLET | Freq: Every evening | ORAL | 0 refills | Status: AC | PRN

## 2024-08-09 NOTE — Assessment & Plan Note (Signed)
 Update FLP off medication. The 10-year ASCVD risk score (Arnett DK, et al., 2019) is: 4.5%   Values used to calculate the score:     Age: 53 years     Clincally relevant sex: Male     Is Non-Hispanic African American: No     Diabetic: No     Tobacco smoker: No     Systolic Blood Pressure: 130 mmHg     Is BP treated: No     HDL Cholesterol: 39.2 mg/dL     Total Cholesterol: 171 mg/dL d

## 2024-08-09 NOTE — Assessment & Plan Note (Signed)
 Refilled flonaes

## 2024-08-09 NOTE — Assessment & Plan Note (Signed)
 Chronic, stable period on sertraline  50mg  daily - continue

## 2024-08-09 NOTE — Assessment & Plan Note (Signed)
 Update A1c ?

## 2024-08-09 NOTE — Assessment & Plan Note (Addendum)
 Continues CPAP, followed by Dr Isaiah last seen 2023

## 2024-08-09 NOTE — Assessment & Plan Note (Signed)
 Preventative protocols reviewed and updated unless pt declined. Discussed healthy diet and lifestyle.

## 2024-08-09 NOTE — Assessment & Plan Note (Addendum)
 Ongoing for a few weeks, suspect lateral ankle sprain.  Supportive measures reviewed including bracing, elevation, ice, topical NSAIDs. Provided with exercises from [facility] pt advisor for ankle sprain  Update if not improving with treatment.

## 2024-08-09 NOTE — Assessment & Plan Note (Signed)
 Rare Xanax  PRN flights

## 2024-08-09 NOTE — Assessment & Plan Note (Addendum)
 Reviewed healthy diet and lifestyle changes to effect sustainable weight loss.   He recently started doing laps at his new pool, encouraged incorporating walking into routine as well.

## 2024-08-09 NOTE — Patient Instructions (Addendum)
 Labs today  Good to see you today Medicines refilled.  Return as needed or in 1 year for next physical Likely ankle sprain - use ankle brace especially with prolonged walking, elevate leg, continue icing as needed, may try topical voltaren anti inflammatory. Let us  know if no improving with this.

## 2024-08-09 NOTE — Assessment & Plan Note (Signed)
Update b12 levels off replacement

## 2024-08-09 NOTE — Assessment & Plan Note (Addendum)
 R neck swelling ? lymph node vs enlarged salivary gland.  Pt asymptomatic.  Update CBC. Update if ongoing to consider imaging.

## 2024-08-09 NOTE — Progress Notes (Signed)
 Ph: [phone] Fax: [phone]   Patient ID: Nicholas Saunders, male    DOB: Sep 17, 1971, 53 y.o.   MRN: [id number]  This visit was conducted in person.  BP 130/82   Pulse 63   Temp 98.3 F (36.8 C) (Oral)   Ht 5' 6 (1.676 m)   Wt 226 lb (102.5 kg)   SpO2 97%   BMI 36.48 kg/m    CC: CPE Subjective:   HPI: Nicholas Saunders is a 53 y.o. male presenting on 08/09/2024 for Annual Exam   Stable period on sertraline . Takes xanax  prn flights.  Skin tag under left armpit.   Preventative: Colonoscopy 05/2023 - TAx2, rpt ? Romero) Prostate cancer screening - no nocturia. No fmhx.  Lung cancer screening - not eligible  Flu shot - declines  COVID vaccine - declines  Td 2006, Tdap 03/2014  Prevnar-20 - declines  Hep A/B 03/2014, 04/2014  Shingrix - discussed, to consider  Seat belt use discussed  Sunscreen use discussed.  No changing moles on skin.  Sleep - averaging 6-7 hrs/night Non smoker  Alcohol - occ Dentist Q6 months  Eye doctor - yearly   Caffeine: 2 cups coffee  Married.  Lives with wife, 1 dog  Daughter  Edu: 12th grade  Occ: Marine scientist, works from home  Activity: new pool at home planning to do labs Diet: some water, a few zero sodas/day, fruits/vegetables daily     Relevant past medical, surgical, family and social history reviewed and updated as indicated. Interim medical history since our last visit reviewed. Allergies and medications reviewed and updated. Outpatient Medications Prior to Visit  Medication Sig Dispense Refill   Multiple Vitamins-Minerals (MULTIVITAMIN ADULTS 50+) TABS      ALPRAZolam  (XANAX ) 0.25 MG tablet Take 1 tablet (0.25 mg total) by mouth at bedtime as needed. 30 tablet 0   fluticasone  (FLONASE ) 50 MCG/ACT nasal spray SPRAY 2 SPRAYS INTO EACH NOSTRIL EVERY DAY 48 mL 0   sertraline  (ZOLOFT ) 50 MG tablet TAKE 1 TABLET BY MOUTH EVERY DAY 90 tablet 0   cyclobenzaprine  (FLEXERIL ) 10 MG tablet Take 1 tablet (10 mg total) by mouth 2  (two) times daily as needed for muscle spasms. (Patient not taking: Reported on 08/09/2024) 20 tablet 0   No facility-administered medications prior to visit.     Per HPI unless specifically indicated in ROS section below Review of Systems  Constitutional:  Negative for activity change, appetite change, chills, fatigue, fever and unexpected weight change.  HENT:  Negative for hearing loss.   Eyes:  Negative for visual disturbance.  Respiratory:  Negative for cough, chest tightness, shortness of breath and wheezing.   Cardiovascular:  Negative for chest pain, palpitations and leg swelling.  Gastrointestinal:  Negative for abdominal distention, abdominal pain, blood in stool, constipation, diarrhea, nausea and vomiting.  Genitourinary:  Negative for difficulty urinating and hematuria.  Musculoskeletal:  Negative for arthralgias, myalgias and neck pain.  Skin:  Negative for rash.  Neurological:  Negative for dizziness, seizures, syncope and headaches.  Hematological:  Negative for adenopathy. Does not bruise/bleed easily.  Psychiatric/Behavioral:  Negative for dysphoric mood. The patient is not nervous/anxious.     Objective:  BP 130/82   Pulse 63   Temp 98.3 F (36.8 C) (Oral)   Ht 5' 6 (1.676 m)   Wt 226 lb (102.5 kg)   SpO2 97%   BMI 36.48 kg/m   Wt Readings from Last 3 Encounters:  08/09/24 226 lb (102.5 kg)  06/01/23  225 lb (102.1 kg)  05/04/23 225 lb (102.1 kg)      Physical Exam Vitals and nursing note reviewed.  Constitutional:      General: He is not in acute distress.    Appearance: Normal appearance. He is well-developed. He is not ill-appearing.  HENT:     Head: Normocephalic and atraumatic.     Salivary Glands: Right salivary gland is diffusely enlarged. Left salivary gland is not diffusely enlarged.     Comments: No palpable parotid mass     Right Ear: Hearing, tympanic membrane, ear canal and external ear normal.     Left Ear: Hearing, tympanic membrane, ear  canal and external ear normal.     Mouth/Throat:     Mouth: Mucous membranes are moist.     Dentition: Normal dentition. No dental tenderness.     Tongue: No lesions.     Palate: No mass.     Pharynx: Oropharynx is clear. No oropharyngeal exudate or posterior oropharyngeal erythema.     Comments: No gum or tooth or OP abnormality Eyes:     General: No scleral icterus.    Extraocular Movements: Extraocular movements intact.     Conjunctiva/sclera: Conjunctivae normal.     Pupils: Pupils are equal, round, and reactive to light.  Neck:     Thyroid: No thyroid mass or thyromegaly.  Cardiovascular:     Rate and Rhythm: Normal rate and regular rhythm.     Pulses: Normal pulses.          Radial pulses are 2+ on the right side and 2+ on the left side.     Heart sounds: Normal heart sounds. No murmur heard. Pulmonary:     Effort: Pulmonary effort is normal. No respiratory distress.     Breath sounds: Normal breath sounds. No wheezing, rhonchi or rales.  Abdominal:     General: Bowel sounds are normal. There is no distension.     Palpations: Abdomen is soft. There is no mass.     Tenderness: There is no abdominal tenderness. There is no guarding or rebound.     Hernia: No hernia is present.  Musculoskeletal:        General: Swelling and tenderness present. Normal range of motion.     Cervical back: Normal range of motion and neck supple.     Right lower leg: No edema.     Left lower leg: No edema.     Comments:  R lateral ankle swelling and mild discomfort to palpation of lateral ankle ligament at anterior TF ligament.  Otherwise full ROM bilat ankles 2+ DP bilaterally  Lymphadenopathy:     Head:     Right side of head: Submandibular adenopathy present. No submental, tonsillar, preauricular or posterior auricular adenopathy.     Left side of head: No submental, submandibular, tonsillar, preauricular or posterior auricular adenopathy.     Cervical: No cervical adenopathy.     Upper  Body:     Right upper body: No supraclavicular or axillary adenopathy.     Left upper body: No supraclavicular or axillary adenopathy.  Skin:    General: Skin is warm and dry.     Findings: No rash.  Neurological:     General: No focal deficit present.     Mental Status: He is alert and oriented to person, place, and time.  Psychiatric:        Mood and Affect: Mood normal.        Behavior: Behavior normal.  Thought Content: Thought content normal.        Judgment: Judgment normal.       Results for orders placed or performed in visit on 05/04/23  Lipid panel   Collection Time: 05/04/23  4:33 PM  Result Value Ref Range   Cholesterol 171 0 - 200 mg/dL   Triglycerides 02.9 0.0 - 149.0 mg/dL   HDL 60.79 >60.99 mg/dL   VLDL 80.5 0.0 - 59.9 mg/dL   LDL Cholesterol 887 (H) 0 - 99 mg/dL   Total CHOL/HDL Ratio 4    NonHDL 131.36   Comprehensive metabolic panel   Collection Time: 05/04/23  4:33 PM  Result Value Ref Range   Sodium 137 135 - 145 mEq/L   Potassium 3.7 3.5 - 5.1 mEq/L   Chloride 101 96 - 112 mEq/L   CO2 28 19 - 32 mEq/L   Glucose, Bld 86 70 - 99 mg/dL   BUN 13 6 - 23 mg/dL   Creatinine, Ser 9.01 0.40 - 1.50 mg/dL   Total Bilirubin 0.6 0.2 - 1.2 mg/dL   Alkaline Phosphatase 60 39 - 117 U/L   AST 21 0 - 37 U/L   ALT 26 0 - 53 U/L   Total Protein 6.8 6.0 - 8.3 g/dL   Albumin 4.2 3.5 - 5.2 g/dL   GFR 10.77 >39.99 mL/min   Calcium 9.0 8.4 - 10.5 mg/dL  Hemoglobin J8r   Collection Time: 05/04/23  4:33 PM  Result Value Ref Range   Hgb A1c MFr Bld 5.8 4.6 - 6.5 %  PSA   Collection Time: 05/04/23  4:33 PM  Result Value Ref Range   PSA 0.91 0.10 - 4.00 ng/mL  Vitamin B12   Collection Time: 05/04/23  4:33 PM  Result Value Ref Range   Vitamin B-12 516 211 - 911 pg/mL    Assessment & Plan:   Problem List Items Addressed This Visit     Health maintenance examination - Primary (Chronic)   Preventative protocols reviewed and updated unless pt  declined. Discussed healthy diet and lifestyle.       Phobia, flying   Rare Xanax  PRN flights      Allergic rhinitis   Refilled flonaes      Relevant Medications   fluticasone  (FLONASE ) 50 MCG/ACT nasal spray   Anxiety with depression   Chronic, stable period on sertraline  50mg  daily - continue.      Relevant Medications   ALPRAZolam  (XANAX ) 0.25 MG tablet   sertraline  (ZOLOFT ) 50 MG tablet   Obesity, Class II, BMI 35-39.9, no comorbidity   Reviewed healthy diet and lifestyle changes to effect sustainable weight loss.   He recently started doing laps at his new pool, encouraged incorporating walking into routine as well.       OSA on CPAP   Continues CPAP, followed by Dr Isaiah last seen 2023      HLD (hyperlipidemia)   Update FLP off medication. The 10-year ASCVD risk score (Arnett DK, et al., 2019) is: 4.5%   Values used to calculate the score:     Age: 40 years     Clincally relevant sex: Male     Is Non-Hispanic African American: No     Diabetic: No     Tobacco smoker: No     Systolic Blood Pressure: 130 mmHg     Is BP treated: No     HDL Cholesterol: 39.2 mg/dL     Total Cholesterol: 171 mg/dL d  Relevant Orders   Lipid panel   Comprehensive metabolic panel with GFR   TSH   Low serum vitamin B12   Update b12 levels off replacement      Relevant Orders   Vitamin B12   Prediabetes   Update A1c      Relevant Orders   Hemoglobin A1c   Localized swelling, mass and lump, neck   R neck swelling ? lymph node vs enlarged salivary gland.  Pt asymptomatic.  Update CBC. Update if ongoing to consider imaging.       Relevant Orders   CBC with Differential/Platelet   Pain in lateral portion of right ankle   Ongoing for a few weeks, suspect lateral ankle sprain.  Supportive measures reviewed including bracing, elevation, ice, topical NSAIDs. Provided with exercises from [facility] pt advisor for ankle sprain  Update if not improving with treatment.        Other Visit Diagnoses       Special screening for malignant neoplasm of prostate       Relevant Orders   PSA        Meds ordered this encounter  Medications   ALPRAZolam  (XANAX ) 0.25 MG tablet    Sig: Take 1 tablet (0.25 mg total) by mouth at bedtime as needed.    Dispense:  30 tablet    Refill:  0    This request is for a new prescription for a controlled substance as required by Federal/State law.   fluticasone  (FLONASE ) 50 MCG/ACT nasal spray    Sig: Place 2 sprays into both nostrils daily.    Dispense:  48 mL    Refill:  3   sertraline  (ZOLOFT ) 50 MG tablet    Sig: Take 1 tablet (50 mg total) by mouth daily.    Dispense:  90 tablet    Refill:  3    Orders Placed This Encounter  Procedures   Lipid panel   Comprehensive metabolic panel with GFR   Hemoglobin A1c   TSH   PSA   CBC with Differential/Platelet   Vitamin B12    Patient Instructions  Labs today  Good to see you today Medicines refilled.  Return as needed or in 1 year for next physical Likely ankle sprain - use ankle brace especially with prolonged walking, elevate leg, continue icing as needed, may try topical voltaren anti inflammatory. Let us  know if no improving with this.   Follow up plan: Return in about 1 year (around 08/09/2025) for annual exam, prior fasting for blood work.  Anton Blas, MD

## 2024-08-11 ENCOUNTER — Ambulatory Visit: Payer: Self-pay | Admitting: Family Medicine

## 2024-10-12 ENCOUNTER — Encounter: Payer: Self-pay | Admitting: Nurse Practitioner

## 2024-10-12 ENCOUNTER — Ambulatory Visit (INDEPENDENT_AMBULATORY_CARE_PROVIDER_SITE_OTHER): Admitting: Nurse Practitioner

## 2024-10-12 VITALS — BP 136/80 | HR 76 | Temp 98.6°F | Ht 66.0 in | Wt 230.4 lb

## 2024-10-12 DIAGNOSIS — M25512 Pain in left shoulder: Secondary | ICD-10-CM | POA: Diagnosis not present

## 2024-10-12 MED ORDER — PREDNISONE 10 MG PO TABS: ORAL_TABLET | ORAL | 0 refills | Status: AC

## 2024-10-12 MED ORDER — MELOXICAM 7.5 MG PO TABS: 7.5000 mg | ORAL_TABLET | Freq: Every day | ORAL | 0 refills | Status: AC

## 2024-10-12 NOTE — Progress Notes (Signed)
 Acute Patient Office Visit  Subjective   Patient ID: Nicholas Saunders, male    DOB: 1971-05-11  Age: 53 y.o. MRN: [id number]  CC:  Chief Complaint  Patient presents with   Acute Visit    Left shoulder pain 8/10 Left thumb is tingly & some pain in the neck    HPI Nicholas Saunders is a 53 year old male who presents with left shoulder pain for two weeks.  The left shoulder pain began a few days after pressure washing his RV and is primarily located under the left scapula, radiating slightly up the neck. Tingling is present in the left thumb without pain. Ibuprofen 800 mg daily and cyclobenzaprine  at night have not provided relief.   Massages and daily heat packs have also been ineffective. The pain is not constant but is present most of the time and worsens with certain movements. It disrupts sleep, making it difficult to find a comfortable position. Movement does not hurt, but certain activities increase the pain. He has not tried cold therapy without significant improvement.   Health Maintenance  Topic Date Due   Hepatitis B Vaccines 19-59 Average Risk (3 of 3 - 19+ 3-dose series) 09/22/2014   Pneumococcal Vaccine: 50+ Years (1 of 1 - PCV) Never done   Zoster Vaccines- Shingrix (1 of 2) Never done   DTaP/Tdap/Td (3 - Td or Tdap) 03/22/2024   Influenza Vaccine  03/13/2025 (Originally 07/14/2024)   Colonoscopy  05/31/2033   Hepatitis C Screening  Completed   HIV Screening  Completed   HPV VACCINES  Aged Out   Meningococcal B Vaccine  Aged Out   COVID-19 Vaccine  Discontinued       Topic Date Due   Hepatitis B Vaccines 19-59 Average Risk (3 of 3 - 19+ 3-dose series) 09/22/2014    Outpatient Encounter Medications as of 10/12/2024  Medication Sig   ALPRAZolam  (XANAX ) 0.25 MG tablet Take 1 tablet (0.25 mg total) by mouth at bedtime as needed.   fluticasone  (FLONASE ) 50 MCG/ACT nasal spray Place 2 sprays into both nostrils daily.   meloxicam (MOBIC) 7.5 MG tablet Take 1 tablet (7.5 mg  total) by mouth daily.   Multiple Vitamins-Minerals (MULTIVITAMIN ADULTS 50+) TABS    predniSONE  (DELTASONE ) 10 MG tablet Take 4 tablets ( total 40 mg) by mouth for 2 days; take 3 tablets ( total 30 mg) by mouth for 2 days; take 2 tablets ( total 20 mg) by mouth for 1 day; take 1 tablet ( total 10 mg) by mouth for 1 day.   sertraline  (ZOLOFT ) 50 MG tablet Take 1 tablet (50 mg total) by mouth daily.   No facility-administered encounter medications on file as of 10/12/2024.    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   removed by derm   Sleep apnea 2016    Past Surgical History:  Procedure Laterality Date   COLONOSCOPY WITH PROPOFOL  N/A 06/01/2023   2 polyps, rpt Romero Bi, MD)   POLYPECTOMY  06/01/2023   Procedure: POLYPECTOMY;  Surgeon: Therisa Bi, MD;  Location: [facility] ENDOSCOPY;  Service: Gastroenterology;;    Family History  Problem Relation Age of Onset  Alcohol abuse Father    Cirrhosis Father        Alcohol induced   Cancer Mother 42       esophageal (smoker)   Alcohol abuse Brother    Anxiety disorder Sister    Stroke Maternal Grandfather    Heart disease Maternal Grandfather    Heart disease Maternal Grandmother        and MGGM   Alcohol abuse Paternal Grandfather    Diabetes Neg Hx     Social History   Socioeconomic History   Marital status: Married    Spouse name: Not on file   Number of children: 0   Years of education: 12   Highest education level: 12th grade  Occupational History   Occupation: Advertising Account Planner: [employer]  Tobacco Use   Smoking status: Never   Smokeless tobacco: Former    Types: Snuff  Vaping Use   Vaping status: Never Used   Substance and Sexual Activity   Alcohol use: No   Drug use: No   Sexual activity: Yes  Other Topics Concern   Not on file  Social History Narrative   Caffeine: 3-4 caffeinated beverages   Married.  Lives with wife, 1 dog.   No children   Marine scientist, works from home   Activity: started swimming, biking   Diet: water, tries to get fruits and vegetables   Edu: 12th grade   Social Drivers of Corporate Investment Banker Strain: Low Risk  (10/11/2024)   Overall Financial Resource Strain (CARDIA)    Difficulty of Paying Living Expenses: Not hard at all  Food Insecurity: No Food Insecurity (10/11/2024)   Hunger Vital Sign    Worried About Running Out of Food in the Last Year: Never true    Ran Out of Food in the Last Year: Never true  Transportation Needs: No Transportation Needs (10/11/2024)   PRAPARE - Administrator, Civil Service (Medical): No    Lack of Transportation (Non-Medical): No  Physical Activity: Inactive (10/11/2024)   Exercise Vital Sign    Days of Exercise per Week: 0 days    Minutes of Exercise per Session: Not on file  Stress: No Stress Concern Present (10/11/2024)   [employer] of Occupational Health - Occupational Stress Questionnaire    Feeling of Stress: Only a little  Social Connections: Socially Integrated (10/11/2024)   Social Connection and Isolation Panel    Frequency of Communication with Friends and Family: Twice a week    Frequency of Social Gatherings with Friends and Family: Once a week    Attends Religious Services: More than 4 times per year    Active Member of [employer] or Organizations: Yes    Attends Engineer, Structural: More than 4 times per year    Marital Status: Married  Catering Manager Violence: Not on file    ROS Negative unless indicated in HPI.      Objective    BP 136/80   Pulse 76   Temp 98.6 F (37 C)   Ht 5' 6 (1.676 m)   Wt 230 lb 6.4 oz (104.5 kg)   SpO2 99%   BMI 37.19 kg/m    Physical Exam Constitutional:      Appearance: Normal appearance.  Cardiovascular:     Rate and Rhythm: Normal rate and regular rhythm.     Pulses: Normal pulses.     Heart sounds: Normal heart sounds.  Pulmonary:  Effort: Pulmonary effort is normal.     Breath sounds: Normal breath sounds.  Musculoskeletal:        General: Tenderness present.       Arms:  Skin:    General: Skin is warm.     Findings: No bruising.  Neurological:     General: No focal deficit present.     Mental Status: He is alert and oriented to person, place, and time.     Sensory: No sensory deficit.     Motor: No weakness.  Psychiatric:        Mood and Affect: Mood normal.        Behavior: Behavior normal.         Assessment & Plan:  Acute pain of left shoulder Assessment & Plan: Pain and paresthesia in left shoulder and thumb, likely due to nerve impingement or musculoskeletal strain, possibly from recent activity. Tenderness along trapezius muscles. -Prescribed prednisone  taper and meloxicam. Do not take ibuprofen and meloxicam together. -Advised to use warm and cold compress.  -Discussed PT referral pt politely declined at present.  -Follow up with PCP is symptoms not improving.     Other orders -     predniSONE ; Take 4 tablets ( total 40 mg) by mouth for 2 days; take 3 tablets ( total 30 mg) by mouth for 2 days; take 2 tablets ( total 20 mg) by mouth for 1 day; take 1 tablet ( total 10 mg) by mouth for 1 day.  Dispense: 17 tablet; Refill: 0 -     Meloxicam; Take 1 tablet (7.5 mg total) by mouth daily.  Dispense: 30 tablet; Refill: 0    Return if symptoms worsen or fail to improve.   Abas Leicht, NP

## 2024-10-12 NOTE — Assessment & Plan Note (Addendum)
 Pain and paresthesia in left shoulder and thumb, likely due to nerve impingement or musculoskeletal strain, possibly from recent activity. Tenderness along trapezius muscles. -Prescribed prednisone  taper and meloxicam. Do not take ibuprofen and meloxicam together. -Advised to use warm and cold compress.  -Discussed PT referral pt politely declined at present.  -Follow up with PCP is symptoms not improving.
